# Patient Record
Sex: Male | Born: 2000 | Race: White | Hispanic: No | Marital: Single | Smoking: Never smoker
Health system: Southern US, Community
[De-identification: ages and names within clinical notes are randomized; demographics above are authoritative.]

## PROBLEM LIST (undated history)

## (undated) DIAGNOSIS — F419 Anxiety disorder, unspecified: Secondary | ICD-10-CM

## (undated) DIAGNOSIS — K219 Gastro-esophageal reflux disease without esophagitis: Secondary | ICD-10-CM

## (undated) DIAGNOSIS — J3089 Other allergic rhinitis: Secondary | ICD-10-CM

## (undated) DIAGNOSIS — F329 Major depressive disorder, single episode, unspecified: Secondary | ICD-10-CM

## (undated) DIAGNOSIS — F909 Attention-deficit hyperactivity disorder, unspecified type: Secondary | ICD-10-CM

## (undated) DIAGNOSIS — R1115 Cyclical vomiting syndrome unrelated to migraine: Secondary | ICD-10-CM

## (undated) DIAGNOSIS — F32A Depression, unspecified: Secondary | ICD-10-CM

## (undated) HISTORY — DX: Cyclical vomiting syndrome unrelated to migraine: R11.15

## (undated) HISTORY — DX: Depression, unspecified: F32.A

## (undated) HISTORY — PX: NO PAST SURGERIES: SHX2092

## (undated) HISTORY — DX: Anxiety disorder, unspecified: F41.9

---
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
## 2021-01-08 ENCOUNTER — Other Ambulatory Visit: Payer: Self-pay

## 2021-01-08 ENCOUNTER — Ambulatory Visit (INDEPENDENT_AMBULATORY_CARE_PROVIDER_SITE_OTHER): Payer: 59 | Admitting: Family Medicine

## 2021-01-08 ENCOUNTER — Encounter: Payer: Self-pay | Admitting: Family Medicine

## 2021-01-08 VITALS — BP 108/66 | HR 79 | Temp 98.3°F | Ht 70.0 in | Wt 196.0 lb

## 2021-01-08 DIAGNOSIS — K5901 Slow transit constipation: Secondary | ICD-10-CM

## 2021-01-08 DIAGNOSIS — F418 Other specified anxiety disorders: Secondary | ICD-10-CM

## 2021-01-08 DIAGNOSIS — G4700 Insomnia, unspecified: Secondary | ICD-10-CM | POA: Diagnosis not present

## 2021-01-08 DIAGNOSIS — R1115 Cyclical vomiting syndrome unrelated to migraine: Secondary | ICD-10-CM

## 2021-01-08 DIAGNOSIS — J301 Allergic rhinitis due to pollen: Secondary | ICD-10-CM

## 2021-01-08 DIAGNOSIS — M545 Low back pain, unspecified: Secondary | ICD-10-CM

## 2021-01-08 DIAGNOSIS — Z23 Encounter for immunization: Secondary | ICD-10-CM | POA: Diagnosis not present

## 2021-01-08 DIAGNOSIS — G43719 Chronic migraine without aura, intractable, without status migrainosus: Secondary | ICD-10-CM

## 2021-01-08 DIAGNOSIS — K219 Gastro-esophageal reflux disease without esophagitis: Secondary | ICD-10-CM

## 2021-01-08 MED ORDER — MONTELUKAST SODIUM 10 MG PO TABS: 10.0000 mg | ORAL_TABLET | Freq: Every day | ORAL | 1 refills | Status: DC

## 2021-01-08 MED ORDER — HYDROXYZINE PAMOATE 25 MG PO CAPS: 25.0000 mg | ORAL_CAPSULE | Freq: Two times a day (BID) | ORAL | 1 refills | Status: DC | PRN

## 2021-01-08 MED ORDER — DICLOFENAC SODIUM 75 MG PO TBEC: 75.0000 mg | DELAYED_RELEASE_TABLET | Freq: Two times a day (BID) | ORAL | 5 refills | Status: DC | PRN

## 2021-01-08 MED ORDER — LEVOCETIRIZINE DIHYDROCHLORIDE 5 MG PO TABS: 5.0000 mg | ORAL_TABLET | Freq: Every evening | ORAL | 1 refills | Status: DC

## 2021-01-08 MED ORDER — QUETIAPINE FUMARATE 100 MG PO TABS: 50.0000 mg | ORAL_TABLET | Freq: Every day | ORAL | 1 refills | Status: DC

## 2021-01-08 MED ORDER — PROPRANOLOL HCL 20 MG PO TABS: 20.0000 mg | ORAL_TABLET | Freq: Two times a day (BID) | ORAL | 1 refills | Status: DC

## 2021-01-08 MED ORDER — VENLAFAXINE HCL ER 75 MG PO CP24: 75.0000 mg | ORAL_CAPSULE | Freq: Every day | ORAL | 1 refills | Status: DC

## 2021-01-08 MED ORDER — PANTOPRAZOLE SODIUM 20 MG PO TBEC: 20.0000 mg | DELAYED_RELEASE_TABLET | Freq: Every day | ORAL | 1 refills | Status: DC | PRN

## 2021-01-08 NOTE — Patient Instructions (Addendum)
Great to see you today.  I have refilled the medication(s) we provide.   If labs were collected, we will inform you of lab results once received either by echart message or telephone call.   - echart message- for normal results that have been seen by the patient already.   - telephone call: abnormal results or if patient has not viewed results in their echart.  

## 2021-01-08 NOTE — Progress Notes (Signed)
Patient ID: Stephen Sexton, male  DOB: 2000/05/23, 20 y.o.   MRN: [id number] Patient Care Team    Relationship Specialty Notifications Start End  Ma Hillock, DO PCP - General Family Medicine  10/21/19     Chief Complaint  Patient presents with   Depression    CMC;pt is not fasting    Subjective: Stephen Sexton is a 20 y.o.  male present for [facility] follow up Depression/anxiety/insomnia/ADHD: pt reports compliance  with effexor 75, vistartil prn (rarely needs now) and seroquel 50 mg Qhs.He reports he is sleeping much better and feeling improved from last visit. He tries to focus on the positivity in his life and his mother. His grandmother has dementia and she is having some behavioral changes with her dementia. He feels taking care of her and seeing these changes is causing him more sadness.  Prior note Patient reports he is prescribed trazodone 50 mg nightly for his insomnia.  He does feel this dose causes him to be sleepy in the morning.  He has not tried a lower dose in the past.  He has had a diagnosis of ADHD since he was a child.  He was tried on many different medications the last notable medication on record was Focalin.  He states he has not taken this medication in almost a year.  He reports he has become better adjusted to his ADHD.  Migraine, chronic: Patient reports compliance   with propranolol 20 mg twice daily .He has not been tried on other medications- regimen present since 2016ish. He reports he has not had any migraines since our last visit.   Cyclic vomiting: Pt reports he has had normal BM since started Senakot at night.  He does not take opioids or use MJ.  Prior note: Patient reports he has had a history of cyclic vomiting.  At one time he had a great deal difficulty getting enough nutrients secondary to cyclic vomiting.  He has been tried on Zofran, Phenergan and propranolol for his cyclic vomiting.  Allergies: Patient reports he has been  prescribed Singulair and levocetirizine for his allergies. Allergies are well controlled.   Depression screen [facility] 2/9 01/08/2021 10/08/2020 04/26/2020 10/21/2019  Decreased Interest 1 2 1 2   Down, Depressed, Hopeless 1 3 1 1   PHQ - 2 Score 2 5 2 3   Altered sleeping 0 3 2 3   Tired, decreased energy 0 3 0 3  Change in appetite 1 1 0 2  Feeling bad or failure about yourself  0 2 1 1   Trouble concentrating 0 0 0 1  Moving slowly or fidgety/restless 0 0 0 0  Suicidal thoughts 0 1 0 0  PHQ-9 Score 3 15 5 13   Difficult doing work/chores - - - Somewhat difficult   GAD 7 : Generalized Anxiety Score 01/08/2021 10/08/2020 04/26/2020  Nervous, Anxious, on Edge 1 1 1   Control/stop worrying 0 1 1  Worry too much - different things 1 1 1   Trouble relaxing 0 1 0  Restless 0 1 1  Easily annoyed or irritable 1 1 1   Afraid - awful might happen 0 1 0  Total GAD 7 Score 3 7 5     GAD 7 : Generalized Anxiety Score 01/08/2021 10/08/2020 04/26/2020  Nervous, Anxious, on Edge 1 1 1   Control/stop worrying 0 1 1  Worry too much - different things 1 1 1   Trouble relaxing 0 1 0  Restless 0 1 1  Easily annoyed or  irritable '1 1 1  '$ Afraid - awful might happen 0 1 0  Total GAD 7 Score $Remov'3 7 5        'MstFKR$ No flowsheet data found.  Immunization History  Administered Date(s) Administered   DTaP 06/14/2001, 08/30/2001, 10/04/2001, 12/06/2002, 04/04/2005   HPV 9-valent 03/22/2015, 09/13/2015   Hepatitis A, Ped/Adol-2 Dose 06/18/2006, 04/12/2010   Hepatitis B, ped/adol 03-Oct-2001, 05/12/2001, 12/30/2001   HiB (PRP-T) 06/14/2001, 08/30/2001, 10/04/2001, 08/17/2002   IPV 06/14/2001, 08/30/2001, 04/28/2002, 04/04/2005   Influenza Split 03/15/2009, 04/18/2009, 03/21/2010   Influenza, Seasonal, Injecte, Preservative Fre 02/21/2011   Influenza,inj,Quad PF,6+ Mos 03/22/2015, 12/13/2015, 11/25/2019, 01/08/2021   Influenza-Unspecified 11/12/2016   MMR 08/17/2002   MMRV 04/04/2005   Meningococcal Conjugate 05/06/2012    PFIZER(Purple Top)SARS-COV-2 Vaccination 06/30/2019, 07/20/2019   Pneumococcal Conjugate-13 06/14/2001, 08/30/2001, 10/04/2001, 04/28/2002   Tdap 05/06/2012   Varicella 04/28/2002    No results found.  Past Medical History:  Diagnosis Date   Abdominal migraine 09/08/2017   Acid reflux    ADHD    Anxiety    Cyclic vomiting syndrome    Depression    Dysphonia 04/14/2017   Environmental and seasonal allergies    Tension headache 12/26/2016   Allergies  Allergen Reactions   Amitriptyline     Anger   Past Surgical History:  Procedure Laterality Date   NO PAST SURGERIES     Family History  Problem Relation Age of Onset   Arthritis Mother    Diabetes Mother    Hypertension Mother    Hyperlipidemia Mother    Arthritis Father    Drug abuse Brother    Arthritis Maternal Grandmother    Cancer Maternal Grandmother    Depression Maternal Grandmother    Diabetes Maternal Grandmother    Hyperlipidemia Maternal Grandmother    Hypertension Maternal Grandmother    Miscarriages / Stillbirths Maternal Grandmother    Arthritis Maternal Grandfather    Cancer Maternal Grandfather    Diabetes Maternal Grandfather    Heart attack Maternal Grandfather    Heart disease Maternal Grandfather    Hyperlipidemia Maternal Grandfather    Hypertension Maternal Grandfather    Miscarriages / Stillbirths Paternal Grandmother    Stroke Paternal Grandmother    Hypertension Paternal Grandmother    Diabetes Paternal Grandmother    Cancer Paternal Grandmother    Cancer Paternal Grandfather    Diabetes Paternal Grandfather    Social History   Social History Narrative   Marital status/children/pets: Single.  Lives with mother and grandparent.   Education/employment: 12th grade graduate> attending college   Safety:      -Wears a bicycle helmet riding a bike: Yes     -smoke alarm in the home:Yes     - wears seatbelt: Yes     - Feels safe in their relationships: Yes    Allergies as of 01/08/2021        Reactions   Amitriptyline    Anger        Medication List        Accurate as of January 08, 2021  8:51 AM. If you have any questions, ask your nurse or doctor.          diclofenac 75 MG EC tablet Commonly known as: VOLTAREN Take 1 tablet (75 mg total) by mouth 2 (two) times daily as needed.   hydrOXYzine 25 MG capsule Commonly known as: Vistaril Take 1 capsule (25 mg total) by mouth 2 (two) times daily as needed.   levocetirizine 5 MG tablet  Commonly known as: XYZAL Take 1 tablet (5 mg total) by mouth every evening.   montelukast 10 MG tablet Commonly known as: SINGULAIR Take 1 tablet (10 mg total) by mouth at bedtime.   ondansetron 8 MG tablet Commonly known as: Zofran Take 1 tablet (8 mg total) by mouth every 8 (eight) hours as needed for nausea or vomiting.   pantoprazole 20 MG tablet Commonly known as: PROTONIX Take 1 tablet (20 mg total) by mouth daily as needed (Acid Reflux).   propranolol 20 MG tablet Commonly known as: INDERAL Take 1 tablet (20 mg total) by mouth 2 (two) times daily.   QUEtiapine 100 MG tablet Commonly known as: SEROquel Take 0.5 tablets (50 mg total) by mouth at bedtime. What changed: how much to take Changed by: Howard Pouch, DO   tiZANidine 2 MG tablet Commonly known as: ZANAFLEX Take 1 tablet (2 mg total) by mouth at bedtime. What changed:  when to take this reasons to take this   venlafaxine XR 75 MG 24 hr capsule Commonly known as: Effexor XR Take 1 capsule (75 mg total) by mouth daily with breakfast.        All past medical history, surgical history, allergies, family history, immunizations andmedications were updated in the EMR today and reviewed under the history and medication portions of their EMR.      ROS: 14 pt review of systems performed and negative (unless mentioned in an HPI)  Objective: BP 108/66   Pulse 79   Temp 98.3 F (36.8 C) (Oral)   Ht 5\' 10"  (1.778 m)   Wt 196 lb (88.9 kg)   SpO2  96%   BMI 28.12 kg/m  Gen: Afebrile. No acute distress. Nontoxic, very pleasant male.  HENT: AT. [facility]. No cough. MMM Eyes:Pupils Equal Round Reactive to light, Extraocular movements intact,  Conjunctiva without redness, discharge or icterus. CV: RRR no murmur Chest: CTAB, no wheeze or crackles Skin: no rashes, purpura or petechiae.  Neuro:  Normal gait. PERLA. EOMi. Alert. Oriented x3 Psych: Normal affect, dress and demeanor. Normal speech. Normal thought content and judgment..    Assessment/plan: Stephen Sexton is a 20 y.o. male present for  Chronic migraine Stable.  Continue  propranolol 20 mg BID  GERD/cyclic vomiting Stable.  Continue Protonix 20 QD continue senakot nightly. Goal 3 BM a week.  Consider Remeron, Topamax, B6 if worsening.  Reported allergy to amitriptyline  Depression with anxiety Feeling much better.  Continue Effexor 75 mg QD  Continue Vistaril 25 mg BID PRN Continue seroquel50mg  [facility]hs Referral to psychiatry placed for him last visit.  F/u 3 mos.   Allergic rhinitis due to pollen, unspecified seasonality Stable.  Continue Xyzal Continue Singulair  Influenza immunization provided today.   Return in about 5 months (around 06/24/2021) for [facility].  Orders Placed This Encounter  Procedures   Flu Vaccine QUAD 6+ mos PF IM (Fluarix Quad PF)    Meds ordered this encounter  Medications   QUEtiapine (SEROQUEL) 100 MG tablet    Sig: Take 0.5 tablets (50 mg total) by mouth at bedtime.    Dispense:  45 tablet    Refill:  1   diclofenac (VOLTAREN) 75 MG EC tablet    Sig: Take 1 tablet (75 mg total) by mouth 2 (two) times daily as needed.    Dispense:  30 tablet    Refill:  5   hydrOXYzine (VISTARIL) 25 MG capsule    Sig: Take 1 capsule (25 mg total) by mouth  2 (two) times daily as needed.    Dispense:  180 capsule    Refill:  1   levocetirizine (XYZAL) 5 MG tablet    Sig: Take 1 tablet (5 mg total) by mouth every evening.    Dispense:  90 tablet     Refill:  1   montelukast (SINGULAIR) 10 MG tablet    Sig: Take 1 tablet (10 mg total) by mouth at bedtime.    Dispense:  90 tablet    Refill:  1   pantoprazole (PROTONIX) 20 MG tablet    Sig: Take 1 tablet (20 mg total) by mouth daily as needed (Acid Reflux).    Dispense:  90 tablet    Refill:  1   propranolol (INDERAL) 20 MG tablet    Sig: Take 1 tablet (20 mg total) by mouth 2 (two) times daily.    Dispense:  180 tablet    Refill:  1   venlafaxine XR (EFFEXOR XR) 75 MG 24 hr capsule    Sig: Take 1 capsule (75 mg total) by mouth daily with breakfast.    Dispense:  90 capsule    Refill:  1     Referral Orders  No referral(s) requested today    Note is dictated utilizing voice recognition software. Although note has been proof read prior to signing, occasional typographical errors still can be missed. If any questions arise, please do not hesitate to call for verification.  Electronically signed by: Howard Pouch, DO [place]

## 2021-01-21 NOTE — Progress Notes (Signed)
[name] D.[name] [address] [place] [address] Phone: [phone]   Assessment and Plan:     1. Chronic bilateral low back pain without sciatica 2. Somatic dysfunction of cervical region 3. Somatic dysfunction of thoracic region 4. Somatic dysfunction of lumbar region 5. Somatic dysfunction of pelvic region 6. Somatic dysfunction of rib region - chronic with exacerbation, subsequent visit - Recurrence of typical musculoskeletal complaints with most prominent being low back today.  No red flag symptoms - Patient has received significant relief with OMT in the past.  Elects for repeat OMT today.  Tolerated well per note below. - Decision today to treat with OMT was based on Physical Exam   After verbal consent patient was treated with HVLA (high velocity low amplitude), ME (muscle energy), FPR (flex positional release), ST (soft tissue), PC/PD (Pelvic Compression/ Pelvic Decompression) techniques in cervical, rib, thoracic, lumbar, and pelvic areas. Patient tolerated the procedure well with improvement in symptoms.  Patient educated on potential side effects of soreness and recommended to rest, hydrate, and use Tylenol as needed for pain control.   Pertinent previous records reviewed include none pertinent   Follow Up: 4 weeks for repeat OMT   Subjective:   I, Debbe Odea, am serving as a scribe for Dr. Richardean Sale  Chief Complaint: Neck and back pain   HPI:   01/22/21 Patient is a 20 year old male presenting with neck and back pain. Patient last saw [name] on 11/15/2020 for this reason and had OMT. Today patient states that he is here for maintenance OMT.    Relevant Historical Information: None pertinent  Additional pertinent review of systems negative.  Current Outpatient Medications  Medication Sig Dispense Refill   diclofenac (VOLTAREN) 75 MG EC tablet Take 1 tablet (75 mg total) by mouth 2 (two) times daily as needed.  30 tablet 5   hydrOXYzine (VISTARIL) 25 MG capsule Take 1 capsule (25 mg total) by mouth 2 (two) times daily as needed. 180 capsule 1   levocetirizine (XYZAL) 5 MG tablet Take 1 tablet (5 mg total) by mouth every evening. 90 tablet 1   montelukast (SINGULAIR) 10 MG tablet Take 1 tablet (10 mg total) by mouth at bedtime. 90 tablet 1   ondansetron (ZOFRAN) 8 MG tablet Take 1 tablet (8 mg total) by mouth every 8 (eight) hours as needed for nausea or vomiting. 20 tablet 0   pantoprazole (PROTONIX) 20 MG tablet Take 1 tablet (20 mg total) by mouth daily as needed (Acid Reflux). 90 tablet 1   propranolol (INDERAL) 20 MG tablet Take 1 tablet (20 mg total) by mouth 2 (two) times daily. 180 tablet 1   QUEtiapine (SEROQUEL) 100 MG tablet Take 0.5 tablets (50 mg total) by mouth at bedtime. 45 tablet 1   tiZANidine (ZANAFLEX) 2 MG tablet Take 1 tablet (2 mg total) by mouth at bedtime. (Patient taking differently: Take 2 mg by mouth at bedtime as needed.) 30 tablet 0   venlafaxine XR (EFFEXOR XR) 75 MG 24 hr capsule Take 1 capsule (75 mg total) by mouth daily with breakfast. 90 capsule 1   No current facility-administered medications for this visit.      Objective:     Vitals:   01/22/21 1330  BP: 110/70  Pulse: 83  SpO2: 97%  Weight: 194 lb (88 kg)  Height: 5\' 10"  (1.778 m)      Body mass index is 27.84 kg/m.    Physical  Exam:     General: Well-appearing, cooperative, sitting comfortably in no acute distress.   OMT Physical Exam:  ASIS Compression Test: Positive Right Cervical: TTP paraspinal, C5 RRSL, C3 RLSR Rib: Bilateral elevated first rib with out TTP Thoracic: TTP paraspinal, T6-9 RRSL Lumbar: TTP paraspinal, L1-3 RLSR Pelvis: Right anterior innominate  Electronically signed by:  [name] D.[name] 1:49 PM 01/22/21

## 2021-01-22 ENCOUNTER — Ambulatory Visit (INDEPENDENT_AMBULATORY_CARE_PROVIDER_SITE_OTHER): Payer: 59 | Admitting: Sports Medicine

## 2021-01-22 ENCOUNTER — Other Ambulatory Visit: Payer: Self-pay

## 2021-01-22 VITALS — BP 110/70 | HR 83 | Ht 70.0 in | Wt 194.0 lb

## 2021-01-22 DIAGNOSIS — M9908 Segmental and somatic dysfunction of rib cage: Secondary | ICD-10-CM

## 2021-01-22 DIAGNOSIS — M9903 Segmental and somatic dysfunction of lumbar region: Secondary | ICD-10-CM

## 2021-01-22 DIAGNOSIS — M9905 Segmental and somatic dysfunction of pelvic region: Secondary | ICD-10-CM

## 2021-01-22 DIAGNOSIS — M9902 Segmental and somatic dysfunction of thoracic region: Secondary | ICD-10-CM | POA: Diagnosis not present

## 2021-01-22 DIAGNOSIS — M545 Low back pain, unspecified: Secondary | ICD-10-CM

## 2021-01-22 DIAGNOSIS — M9901 Segmental and somatic dysfunction of cervical region: Secondary | ICD-10-CM | POA: Diagnosis not present

## 2021-01-22 DIAGNOSIS — G8929 Other chronic pain: Secondary | ICD-10-CM | POA: Diagnosis not present

## 2021-01-22 NOTE — Patient Instructions (Signed)
Good to see you   Follow up in 4 week for repeat OMT

## 2021-02-18 NOTE — Progress Notes (Signed)
[name] D.[name] [address] [place] [address] Phone: [phone]   Assessment and Plan:     1. Neck pain 2. Somatic dysfunction of cervical region 3. Somatic dysfunction of thoracic region 4. Somatic dysfunction of lumbar region 5. Somatic dysfunction of pelvic region 6. Somatic dysfunction of rib region -Chronic with exacerbation, subsequent visit - Recurrence of multiple musculoskeletal complaints with most prominent being neck pain without radicular symptoms - Patient has received significant relief with OMT in the past.  Elects for repeat OMT today.  Tolerated well per note below. - Decision today to treat with OMT was based on Physical Exam   After verbal consent patient was treated with HVLA (high velocity low amplitude), ME (muscle energy), FPR (flex positional release), ST (soft tissue), PC/PD (Pelvic Compression/ Pelvic Decompression) techniques in cervical, rib, thoracic, lumbar, and pelvic areas. Patient tolerated the procedure well with improvement in symptoms.  Patient educated on potential side effects of soreness and recommended to rest, hydrate, and use Tylenol as needed for pain control.   Pertinent previous records reviewed include none   Follow Up: 4 weeks for repeat OMT   Subjective:    I, [name], am serving as a Neurosurgeon for [name]  Chief Complaint: neck and back pain   HPI:   01/22/21 Patient is a 20 year old male presenting with neck and back pain. Patient last saw [name] on 11/15/2020 for this reason and had OMT. Today patient states that he is here for maintenance OMT.    02/19/2021 Patient states after the last OMT visit the following week the neck got tight after laying on a friends bed gave him a crick in his neck for the next two days , back and neck still feel tight   Relevant Historical Information: None pertinent  Additional pertinent review of systems negative.  Current  Outpatient Medications  Medication Sig Dispense Refill   diclofenac (VOLTAREN) 75 MG EC tablet Take 1 tablet (75 mg total) by mouth 2 (two) times daily as needed. 30 tablet 5   hydrOXYzine (VISTARIL) 25 MG capsule Take 1 capsule (25 mg total) by mouth 2 (two) times daily as needed. 180 capsule 1   levocetirizine (XYZAL) 5 MG tablet Take 1 tablet (5 mg total) by mouth every evening. 90 tablet 1   montelukast (SINGULAIR) 10 MG tablet Take 1 tablet (10 mg total) by mouth at bedtime. 90 tablet 1   ondansetron (ZOFRAN) 8 MG tablet Take 1 tablet (8 mg total) by mouth every 8 (eight) hours as needed for nausea or vomiting. 20 tablet 0   pantoprazole (PROTONIX) 20 MG tablet Take 1 tablet (20 mg total) by mouth daily as needed (Acid Reflux). 90 tablet 1   propranolol (INDERAL) 20 MG tablet Take 1 tablet (20 mg total) by mouth 2 (two) times daily. 180 tablet 1   QUEtiapine (SEROQUEL) 100 MG tablet Take 0.5 tablets (50 mg total) by mouth at bedtime. 45 tablet 1   tiZANidine (ZANAFLEX) 2 MG tablet Take 1 tablet (2 mg total) by mouth at bedtime. (Patient taking differently: Take 2 mg by mouth at bedtime as needed.) 30 tablet 0   venlafaxine XR (EFFEXOR XR) 75 MG 24 hr capsule Take 1 capsule (75 mg total) by mouth daily with breakfast. 90 capsule 1   No current facility-administered medications for this visit.      Objective:     Vitals:   02/19/21 1252  BP: 100/80  Pulse: 67  SpO2: 98%  Weight: 190 lb (86.2 kg)  Height: 5\' 10"  (1.778 m)      Body mass index is 27.26 kg/m.    Physical Exam:     General: Well-appearing, cooperative, sitting comfortably in no acute distress.   OMT Physical Exam:  ASIS Compression Test: Positive Right Cervical: TTP paraspinal, C3 RLSR Rib: Bilateral elevated first rib with nTTP Thoracic: TTP paraspinal, T4 RRSR Lumbar: TTP paraspinal, L1-3 RLSR Pelvis: Right anterior innominate with inflare  Electronically signed by:  D[name] 1:08 PM 02/19/21

## 2021-02-19 ENCOUNTER — Ambulatory Visit (INDEPENDENT_AMBULATORY_CARE_PROVIDER_SITE_OTHER): Payer: 59 | Admitting: Sports Medicine

## 2021-02-19 ENCOUNTER — Other Ambulatory Visit: Payer: Self-pay

## 2021-02-19 VITALS — BP 100/80 | HR 67 | Ht 70.0 in | Wt 190.0 lb

## 2021-02-19 DIAGNOSIS — M9905 Segmental and somatic dysfunction of pelvic region: Secondary | ICD-10-CM

## 2021-02-19 DIAGNOSIS — M9902 Segmental and somatic dysfunction of thoracic region: Secondary | ICD-10-CM | POA: Diagnosis not present

## 2021-02-19 DIAGNOSIS — M9901 Segmental and somatic dysfunction of cervical region: Secondary | ICD-10-CM

## 2021-02-19 DIAGNOSIS — M542 Cervicalgia: Secondary | ICD-10-CM

## 2021-02-19 DIAGNOSIS — M9903 Segmental and somatic dysfunction of lumbar region: Secondary | ICD-10-CM | POA: Diagnosis not present

## 2021-02-19 DIAGNOSIS — M9908 Segmental and somatic dysfunction of rib cage: Secondary | ICD-10-CM

## 2021-02-19 NOTE — Patient Instructions (Addendum)
Good to see you  °4 week follow up for repeat OMT °

## 2021-03-19 ENCOUNTER — Ambulatory Visit: Payer: 59 | Admitting: Sports Medicine

## 2021-03-22 NOTE — Progress Notes (Signed)
Stephen Sexton D.[name] [address] [place] [address] Phone: [phone]   Assessment and Plan:    1. Neck pain 2. Somatic dysfunction of cervical region 3. Somatic dysfunction of thoracic region 4. Somatic dysfunction of lumbar region 5. Somatic dysfunction of pelvic region 6. Somatic dysfunction of rib region -Chronic with exacerbation, subsequent sports medicine visit - Recurrence of multiple musculoskeletal complaints with most prominent being in right upper back - No red flag symptoms, so no additional imaging - Patient has received significant relief with OMT in the past.  Elects for repeat OMT today.  Tolerated well per note below. - Decision today to treat with OMT was based on Physical Exam   After verbal consent patient was treated with HVLA (high velocity low amplitude), ME (muscle energy), FPR (flex positional release), ST (soft tissue), PC/PD (Pelvic Compression/ Pelvic Decompression) techniques in cervical, rib, thoracic, lumbar, and pelvic areas. Patient tolerated the procedure well with improvement in symptoms.  Patient educated on potential side effects of soreness and recommended to rest, hydrate, and use Tylenol as needed for pain control.   Pertinent previous records reviewed include none   Follow Up: 4 weeks for maintenance OMT   Subjective:   I, Stephen Sexton, am serving as a Neurosurgeon for [name]  Chief Complaint: neck pain   HPI:   01/22/21 Patient is a 21 year old male presenting with neck and back pain. Patient last saw [name] on 11/15/2020 for this reason and had OMT. Today patient states that he is here for maintenance OMT.     02/19/2021 Patient states after the last OMT visit the following week the neck got tight after laying on a friends bed gave him a crick in his neck for the next two days , back and neck still feel tight   03/25/2021 Patient states that he's feeling pretty good pain in his  right shoulder blade.  States that he has felt good generally for about 4 weeks in between appointments, however going longer than 4 weeks he notices that pain started to return.  No new trauma or symptoms.    Relevant Historical Information: None pertinent  Additional pertinent review of systems negative.  Current Outpatient Medications  Medication Sig Dispense Refill   diclofenac (VOLTAREN) 75 MG EC tablet Take 1 tablet (75 mg total) by mouth 2 (two) times daily as needed. 30 tablet 5   hydrOXYzine (VISTARIL) 25 MG capsule Take 1 capsule (25 mg total) by mouth 2 (two) times daily as needed. 180 capsule 1   levocetirizine (XYZAL) 5 MG tablet Take 1 tablet (5 mg total) by mouth every evening. 90 tablet 1   montelukast (SINGULAIR) 10 MG tablet Take 1 tablet (10 mg total) by mouth at bedtime. 90 tablet 1   ondansetron (ZOFRAN) 8 MG tablet Take 1 tablet (8 mg total) by mouth every 8 (eight) hours as needed for nausea or vomiting. 20 tablet 0   pantoprazole (PROTONIX) 20 MG tablet Take 1 tablet (20 mg total) by mouth daily as needed (Acid Reflux). 90 tablet 1   propranolol (INDERAL) 20 MG tablet Take 1 tablet (20 mg total) by mouth 2 (two) times daily. 180 tablet 1   QUEtiapine (SEROQUEL) 100 MG tablet Take 0.5 tablets (50 mg total) by mouth at bedtime. 45 tablet 1   tiZANidine (ZANAFLEX) 2 MG tablet Take 1 tablet (2 mg total) by mouth at bedtime. (Patient taking differently: Take 2 mg by mouth at  bedtime as needed.) 30 tablet 0   venlafaxine XR (EFFEXOR XR) 75 MG 24 hr capsule Take 1 capsule (75 mg total) by mouth daily with breakfast. 90 capsule 1   No current facility-administered medications for this visit.      Objective:     Vitals:   03/25/21 1436  BP: 110/70  Pulse: 84  SpO2: 98%  Weight: 191 lb (86.6 kg)  Height: 5\' 10"  (1.778 m)      Body mass index is 27.41 kg/m.    Physical Exam:     General: Well-appearing, cooperative, sitting comfortably in no acute distress.   OMT  Physical Exam:  ASIS Compression Test: Positive Right Cervical: TTP paraspinal, C3 RRSL, C6 RLSR Rib: Bilateral elevated first rib with nTTP Thoracic: Mild TTP paraspinal, T3-6 RRSL Lumbar: Mild TTP paraspinal, L1-3 RLSR Pelvis: Right posterior innominate  Electronically signed by:  D.[facility] 2:53 PM 03/25/21

## 2021-03-25 ENCOUNTER — Ambulatory Visit (INDEPENDENT_AMBULATORY_CARE_PROVIDER_SITE_OTHER): Payer: 59 | Admitting: Sports Medicine

## 2021-03-25 ENCOUNTER — Other Ambulatory Visit: Payer: Self-pay

## 2021-03-25 VITALS — BP 110/70 | HR 84 | Ht 70.0 in | Wt 191.0 lb

## 2021-03-25 DIAGNOSIS — M9901 Segmental and somatic dysfunction of cervical region: Secondary | ICD-10-CM

## 2021-03-25 DIAGNOSIS — M9902 Segmental and somatic dysfunction of thoracic region: Secondary | ICD-10-CM

## 2021-03-25 DIAGNOSIS — M542 Cervicalgia: Secondary | ICD-10-CM

## 2021-03-25 DIAGNOSIS — M9903 Segmental and somatic dysfunction of lumbar region: Secondary | ICD-10-CM | POA: Diagnosis not present

## 2021-03-25 DIAGNOSIS — M9905 Segmental and somatic dysfunction of pelvic region: Secondary | ICD-10-CM

## 2021-03-25 DIAGNOSIS — M9908 Segmental and somatic dysfunction of rib cage: Secondary | ICD-10-CM

## 2021-03-25 NOTE — Patient Instructions (Addendum)
Good to see you  4 week follow up   

## 2021-04-15 NOTE — Progress Notes (Signed)
Stephen Sexton D.[facility] [address] [place] [address] Phone: [phone]   Assessment and Plan:     1. Chronic bilateral low back pain without sciatica 2. Somatic dysfunction of thoracic region 3. Somatic dysfunction of lumbar region 4. Somatic dysfunction of pelvic region -Chronic with exacerbation, subsequent visit - Recurrence of multiple musculoskeletal complaints with most prominent being in bilateral hips and low back - No red flag symptoms, so no imaging taken today - Patient has received significant relief with OMT in the past.  Elects for repeat OMT today.  Tolerated well per note below. - Decision today to treat with OMT was based on Physical Exam   After verbal consent patient was treated with HVLA (high velocity low amplitude), ME (muscle energy), FPR (flex positional release), ST (soft tissue), PC/PD (Pelvic Compression/ Pelvic Decompression) techniques in cervical, rib, thoracic, lumbar, and pelvic areas. Patient tolerated the procedure well with improvement in symptoms.  Patient educated on potential side effects of soreness and recommended to rest, hydrate, and use Tylenol as needed for pain control.   Pertinent previous records reviewed include none   Follow Up: 4 weeks for OMT maintenance.  It has been 6 weeks since his last OMT, and patient feels that his symptoms are returning after 4 weeks.  We will plan on continued 4-week visits   Subjective:   I, Stephen Sexton, am serving as a Neurosurgeon for Doctor Stephen Sexton  Chief Complaint: neck pain   HPI:    01/22/21 Patient is a 21 year old male presenting with neck and back pain. Patient last saw Dr. Katrinka Sexton on 11/15/2020 for this reason and had OMT. Today patient states that he is here for maintenance OMT.     02/19/2021 Patient states after the last OMT visit the following week the neck got tight after laying on a friends bed gave him a crick in his neck for the next two days ,  back and neck still feel tight    03/25/2021 Patient states that he's feeling pretty good pain in his right shoulder blade.  States that he has felt good generally for about 4 weeks in between appointments, however going longer than 4 weeks he notices that pain started to return.  No new trauma or symptoms.  04/22/2021 Patient states that his hips have been hurting him the last couple of days, R shoulder has been giving him some problems 04/03/2021.     Relevant Historical Information: None pertinent  Additional pertinent review of systems negative.  Current Outpatient Medications  Medication Sig Dispense Refill   diclofenac (VOLTAREN) 75 MG EC tablet Take 1 tablet (75 mg total) by mouth 2 (two) times daily as needed. 30 tablet 5   hydrOXYzine (VISTARIL) 25 MG capsule Take 1 capsule (25 mg total) by mouth 2 (two) times daily as needed. 180 capsule 1   levocetirizine (XYZAL) 5 MG tablet Take 1 tablet (5 mg total) by mouth every evening. 90 tablet 1   montelukast (SINGULAIR) 10 MG tablet Take 1 tablet (10 mg total) by mouth at bedtime. 90 tablet 1   ondansetron (ZOFRAN) 8 MG tablet Take 1 tablet (8 mg total) by mouth every 8 (eight) hours as needed for nausea or vomiting. 20 tablet 0   pantoprazole (PROTONIX) 20 MG tablet Take 1 tablet (20 mg total) by mouth daily as needed (Acid Reflux). 90 tablet 1   propranolol (INDERAL) 20 MG tablet Take 1 tablet (20 mg total) by mouth 2 (  two) times daily. 180 tablet 1   QUEtiapine (SEROQUEL) 100 MG tablet Take 0.5 tablets (50 mg total) by mouth at bedtime. 45 tablet 1   tiZANidine (ZANAFLEX) 2 MG tablet Take 1 tablet (2 mg total) by mouth at bedtime. (Patient taking differently: Take 2 mg by mouth at bedtime as needed.) 30 tablet 0   venlafaxine XR (EFFEXOR XR) 75 MG 24 hr capsule Take 1 capsule (75 mg total) by mouth daily with breakfast. 90 capsule 1   No current facility-administered medications for this visit.      Objective:     Vitals:    04/22/21 1457  BP: 110/72  Pulse: 96  SpO2: 98%  Weight: 190 lb (86.2 kg)  Height: 5\' 10"  (1.778 m)      Body mass index is 27.26 kg/m.    Physical Exam:     General: Well-appearing, cooperative, sitting comfortably in no acute distress.   OMT Physical Exam:  ASIS Compression Test: Positive Right Thoracic: TTP paraspinal, T8-10 RRSL Lumbar: TTP paraspinal, L1-3 RLSR, L5 rotated right Pelvis: Right posterior innominate, left anterior innominate  Electronically signed by:  [facility] 3:14 PM 04/22/21

## 2021-04-22 ENCOUNTER — Ambulatory Visit (INDEPENDENT_AMBULATORY_CARE_PROVIDER_SITE_OTHER): Payer: 59 | Admitting: Sports Medicine

## 2021-04-22 ENCOUNTER — Other Ambulatory Visit: Payer: Self-pay

## 2021-04-22 VITALS — BP 110/72 | HR 96 | Ht 70.0 in | Wt 190.0 lb

## 2021-04-22 DIAGNOSIS — G8929 Other chronic pain: Secondary | ICD-10-CM

## 2021-04-22 DIAGNOSIS — M545 Low back pain, unspecified: Secondary | ICD-10-CM

## 2021-04-22 DIAGNOSIS — M9902 Segmental and somatic dysfunction of thoracic region: Secondary | ICD-10-CM

## 2021-04-22 DIAGNOSIS — M9903 Segmental and somatic dysfunction of lumbar region: Secondary | ICD-10-CM

## 2021-04-22 DIAGNOSIS — M9905 Segmental and somatic dysfunction of pelvic region: Secondary | ICD-10-CM

## 2021-04-22 NOTE — Patient Instructions (Addendum)
Good to see you  °4 week follow up for repeat OMT °

## 2021-05-10 ENCOUNTER — Ambulatory Visit (INDEPENDENT_AMBULATORY_CARE_PROVIDER_SITE_OTHER): Payer: 59 | Admitting: Nurse Practitioner

## 2021-05-10 ENCOUNTER — Encounter: Payer: Self-pay | Admitting: Nurse Practitioner

## 2021-05-10 ENCOUNTER — Other Ambulatory Visit: Payer: Self-pay

## 2021-05-10 VITALS — BP 118/68 | HR 110 | Temp 97.9°F | Wt 199.4 lb

## 2021-05-10 DIAGNOSIS — K029 Dental caries, unspecified: Secondary | ICD-10-CM

## 2021-05-10 MED ORDER — AMOXICILLIN-POT CLAVULANATE 875-125 MG PO TABS: 1.0000 | ORAL_TABLET | Freq: Two times a day (BID) | ORAL | 0 refills | Status: DC

## 2021-05-10 NOTE — Progress Notes (Signed)
? ?Acute Office Visit ? ?Subjective:  ? ? Patient ID: Stephen Sexton, male    DOB: 2000/10/25, 21 y.o.   MRN: [id number] ? ?Chief Complaint  ?Patient presents with  ? Oral Pain  ?  Pt c/o toothache x2-3 days  ? ? ?HPI ?Patient is in today for right sided tooth pain that has been occurring off and on since November 2022. He went to a dentist and was told he has a large cavity, however he can't afford the treatment. He is looking for a new dentist to pull the tooth. He has been having trouble eating due the pain. He denies fevers. He has been taking ibuprofen and tylenol which sometimes helps the pain, but not when it is severe like last night. The pain tends to be intermittent.  ? ?Past Medical History:  ?Diagnosis Date  ? Abdominal migraine 09/08/2017  ? Acid reflux   ? ADHD   ? Anxiety   ? Cyclic vomiting syndrome   ? Depression   ? Dysphonia 04/14/2017  ? Environmental and seasonal allergies   ? Tension headache 12/26/2016  ? ? ?Past Surgical History:  ?Procedure Laterality Date  ? NO PAST SURGERIES    ? ? ?Family History  ?Problem Relation Age of Onset  ? Arthritis Mother   ? Diabetes Mother   ? Hypertension Mother   ? Hyperlipidemia Mother   ? Arthritis Father   ? Drug abuse Brother   ? Arthritis Maternal Grandmother   ? Cancer Maternal Grandmother   ? Depression Maternal Grandmother   ? Diabetes Maternal Grandmother   ? Hyperlipidemia Maternal Grandmother   ? Hypertension Maternal Grandmother   ? Miscarriages / Stillbirths Maternal Grandmother   ? Arthritis Maternal Grandfather   ? Cancer Maternal Grandfather   ? Diabetes Maternal Grandfather   ? Heart attack Maternal Grandfather   ? Heart disease Maternal Grandfather   ? Hyperlipidemia Maternal Grandfather   ? Hypertension Maternal Grandfather   ? Miscarriages / Stillbirths Paternal Grandmother   ? Stroke Paternal Grandmother   ? Hypertension Paternal Grandmother   ? Diabetes Paternal Grandmother   ? Cancer Paternal Grandmother   ? Cancer Paternal  Grandfather   ? Diabetes Paternal Grandfather   ? ? ?Social History  ? ?Socioeconomic History  ? Marital status: Single  ?  Spouse name: Not on file  ? Number of children: Not on file  ? Years of education: Not on file  ? Highest education level: Not on file  ?Occupational History  ? Not on file  ?Tobacco Use  ? Smoking status: Never  ? Smokeless tobacco: Never  ?Vaping Use  ? Vaping Use: Never used  ?Substance and Sexual Activity  ? Alcohol use: Not Currently  ? Drug use: Never  ? Sexual activity: Not Currently  ?  Partners: Female  ?Other Topics Concern  ? Not on file  ?Social History Narrative  ? Marital status/children/pets: Single.  Lives with mother and grandparent.  ? Education/employment: 12th grade graduate> attending college  ? Safety:   ?   -Wears a bicycle helmet riding a bike: Yes  ?   -smoke alarm in the home:Yes  ?   - wears seatbelt: Yes  ?   - Feels safe in their relationships: Yes  ? ?Social Determinants of Health  ? ?Financial Resource Strain: Not on file  ?Food Insecurity: Not on file  ?Transportation Needs: Not on file  ?Physical Activity: Not on file  ?Stress: Not on file  ?Social Connections:  Not on file  ?Intimate Partner Violence: Not on file  ? ? ?Outpatient Medications Prior to Visit  ?Medication Sig Dispense Refill  ? diclofenac (VOLTAREN) 75 MG EC tablet Take 1 tablet (75 mg total) by mouth 2 (two) times daily as needed. 30 tablet 5  ? hydrOXYzine (VISTARIL) 25 MG capsule Take 1 capsule (25 mg total) by mouth 2 (two) times daily as needed. 180 capsule 1  ? levocetirizine (XYZAL) 5 MG tablet Take 1 tablet (5 mg total) by mouth every evening. 90 tablet 1  ? montelukast (SINGULAIR) 10 MG tablet Take 1 tablet (10 mg total) by mouth at bedtime. 90 tablet 1  ? ondansetron (ZOFRAN) 8 MG tablet Take 1 tablet (8 mg total) by mouth every 8 (eight) hours as needed for nausea or vomiting. 20 tablet 0  ? pantoprazole (PROTONIX) 20 MG tablet Take 1 tablet (20 mg total) by mouth daily as needed (Acid  Reflux). 90 tablet 1  ? propranolol (INDERAL) 20 MG tablet Take 1 tablet (20 mg total) by mouth 2 (two) times daily. 180 tablet 1  ? QUEtiapine (SEROQUEL) 100 MG tablet Take 0.5 tablets (50 mg total) by mouth at bedtime. 45 tablet 1  ? tiZANidine (ZANAFLEX) 2 MG tablet Take 1 tablet (2 mg total) by mouth at bedtime. (Patient taking differently: Take 2 mg by mouth at bedtime as needed.) 30 tablet 0  ? venlafaxine XR (EFFEXOR XR) 75 MG 24 hr capsule Take 1 capsule (75 mg total) by mouth daily with breakfast. 90 capsule 1  ? ?No facility-administered medications prior to visit.  ? ? ?Allergies  ?Allergen Reactions  ? Amitriptyline   ?  Anger  ? ? ?Review of Systems ?See pertinent positives and negatives per HPI. ?   ?Objective:  ?  ?Physical Exam ?Vitals and nursing note reviewed.  ?Constitutional:   ?   Appearance: Normal appearance.  ?HENT:  ?   Head: Normocephalic.  ?   Mouth/Throat:  ?   Dentition: Dental caries present.  ?Eyes:  ?   Conjunctiva/sclera: Conjunctivae normal.  ?Cardiovascular:  ?   Rate and Rhythm: Normal rate.  ?Pulmonary:  ?   Effort: Pulmonary effort is normal.  ?Musculoskeletal:  ?   Cervical back: Normal range of motion.  ?Skin: ?   General: Skin is warm.  ?Neurological:  ?   General: No focal deficit present.  ?   Mental Status: He is alert and oriented to person, place, and time.  ?Psychiatric:     ?   Mood and Affect: Mood normal.     ?   Behavior: Behavior normal.     ?   Thought Content: Thought content normal.     ?   Judgment: Judgment normal.  ? ? ?BP 118/68   Pulse (!) 110   Temp 97.9 ?F (36.6 ?C) (Temporal)   Wt 199 lb 6.4 oz (90.4 kg)   SpO2 97%   BMI 28.61 kg/m?  ?Wt Readings from Last 3 Encounters:  ?05/10/21 199 lb 6.4 oz (90.4 kg)  ?04/22/21 190 lb (86.2 kg)  ?03/25/21 191 lb (86.6 kg) (88 %, Z= 1.16)*  ? ?* Growth percentiles are based on CDC (Boys, 2-20 Years) data.  ? ? ?Health Maintenance Due  ?Topic Date Due  ? COVID-19 Vaccine (3 - Booster for Pfizer series) 09/14/2019   ? ? ?There are no preventive care reminders to display for this patient. ? ? ?Lab Results  ?Component Value Date  ? TSH 2.93 04/24/2017  ? ?  Lab Results  ?Component Value Date  ? WBC 10.2 09/23/2020  ? HGB 15.9 09/23/2020  ? HCT 46.3 09/23/2020  ? MCV 89.6 09/23/2020  ? PLT 286 09/23/2020  ? ?Lab Results  ?Component Value Date  ? NA 138 09/23/2020  ? K 3.4 (L) 09/23/2020  ? CO2 26 09/23/2020  ? GLUCOSE 106 (H) 09/23/2020  ? BUN 11 09/23/2020  ? CREATININE 1.03 09/23/2020  ? BILITOT 0.3 09/23/2020  ? ALKPHOS 102 09/23/2020  ? AST 26 09/23/2020  ? ALT 45 (H) 09/23/2020  ? PROT 8.5 (H) 09/23/2020  ? ALBUMIN 4.8 09/23/2020  ? CALCIUM 9.6 09/23/2020  ? ANIONGAP 9 09/23/2020  ? ?No results found for: CHOL ?No results found for: HDL ?No results found for: LDLCALC ?No results found for: TRIG ?No results found for: CHOLHDL ?No results found for: HGBA1C ? ?   ?Assessment & Plan:  ? ?Problem List Items Addressed This Visit   ?None ?Visit Diagnoses   ? ? Pain due to dental caries    -  Primary  ? Large carie to R lower tooth with possible abscess. Start augmentin BID x7 days. Orajel, tylenol, and ibuprofen prn pain. F/U with dentist asap.   ? ?  ? ? ? ?Meds ordered this encounter  ?Medications  ? amoxicillin-clavulanate (AUGMENTIN) 875-125 MG tablet  ?  Sig: Take 1 tablet by mouth 2 (two) times daily.  ?  Dispense:  14 tablet  ?  Refill:  0  ? ? ? ?Gerre Scull, NP ? ?

## 2021-05-10 NOTE — Patient Instructions (Signed)
It was great to see you! ? ?Start augmentin twice a day for 7 days. You can use orajel to help with the pain, along with ibuprofen and tylenol as needed. Schedule an appointment with a dentist as soon as possible.  ? ?Let's follow-up if you have any concerns.  ? ?Take care, ? ?Vance Peper, NP ? ?

## 2021-05-17 NOTE — Progress Notes (Signed)
? [name] D.Judd Gaudier ?[facility] ?[address] [place] [address] ?Phone: [phone] ?  ?Assessment and Plan:   ?  ?1. Chronic bilateral low back pain without sciatica ?2. Somatic dysfunction of thoracic region ?3. Somatic dysfunction of lumbar region ?4. Somatic dysfunction of pelvic region ?-Chronic with exacerbation, subsequent visit ?- Recurrence of multiple musculoskeletal complaints with most prominent being in hips bilaterally and upper thoracic spine bilaterally without red flag symptoms ?- Patient has received significant relief with OMT in the past.  Elects for repeat OMT today.  Tolerated well per note below. ?- Decision today to treat with OMT was based on Physical Exam ?  ?After verbal consent patient was treated with HVLA (high velocity low amplitude), ME (muscle energy), FPR (flex positional release), ST (soft tissue), PC/PD (Pelvic Compression/ Pelvic Decompression) techniques in  thoracic, lumbar, and pelvic areas. Patient tolerated the procedure well with improvement in symptoms.  Patient educated on potential side effects of soreness and recommended to rest, hydrate, and use Tylenol as needed for pain control. ?  ?Pertinent previous records reviewed include none ?  ?Follow Up: 4 weeks for repeat OMT maintenance.  4 weeks appears to be the best length of time for repeat OMT for patient to prevent worsening symptoms ?  ?Subjective:   ?I, Jerene Canny, am serving as a Neurosurgeon for Doctor [employer] ? ?Chief Complaint: OMT follow up  ? ?HPI:  ?01/22/21 ?Patient is a 21 year old male presenting with neck and back pain. Patient last saw [name] on 11/15/2020 for this reason and had OMT. Today patient states that he is here for maintenance OMT.   ?  ?02/19/2021 ?Patient states after the last OMT visit the following week the neck got tight after laying on a friends bed gave him a crick in his neck for the next two days , back and neck still feel tight  ?   ?03/25/2021 ?Patient states that he's feeling pretty good pain in his right shoulder blade.  States that he has felt good generally for about 4 weeks in between appointments, however going longer than 4 weeks he notices that pain started to return.  No new trauma or symptoms. ?  ?04/22/2021 ?Patient states that his hips have been hurting him the last couple of days, R shoulder has been giving him some problems 04/03/2021.  ? ?05/20/2021 ?Patient states that hes doing good has some pain in his shoulders and hips  ? ? ? ?Relevant Historical Information: None pertinent ? ?Additional pertinent review of systems negative. ? ?Current Outpatient Medications  ?Medication Sig Dispense Refill  ? amoxicillin-clavulanate (AUGMENTIN) 875-125 MG tablet Take 1 tablet by mouth 2 (two) times daily. 14 tablet 0  ? diclofenac (VOLTAREN) 75 MG EC tablet Take 1 tablet (75 mg total) by mouth 2 (two) times daily as needed. 30 tablet 5  ? hydrOXYzine (VISTARIL) 25 MG capsule Take 1 capsule (25 mg total) by mouth 2 (two) times daily as needed. 180 capsule 1  ? levocetirizine (XYZAL) 5 MG tablet Take 1 tablet (5 mg total) by mouth every evening. 90 tablet 1  ? montelukast (SINGULAIR) 10 MG tablet Take 1 tablet (10 mg total) by mouth at bedtime. 90 tablet 1  ? ondansetron (ZOFRAN) 8 MG tablet Take 1 tablet (8 mg total) by mouth every 8 (eight) hours as needed for nausea or vomiting. 20 tablet 0  ? pantoprazole (PROTONIX) 20 MG tablet Take 1 tablet (20 mg total) by mouth daily  as needed (Acid Reflux). 90 tablet 1  ? propranolol (INDERAL) 20 MG tablet Take 1 tablet (20 mg total) by mouth 2 (two) times daily. 180 tablet 1  ? QUEtiapine (SEROQUEL) 100 MG tablet Take 0.5 tablets (50 mg total) by mouth at bedtime. 45 tablet 1  ? tiZANidine (ZANAFLEX) 2 MG tablet Take 1 tablet (2 mg total) by mouth at bedtime. (Patient taking differently: Take 2 mg by mouth at bedtime as needed.) 30 tablet 0  ? venlafaxine XR (EFFEXOR XR) 75 MG 24 hr capsule Take 1  capsule (75 mg total) by mouth daily with breakfast. 90 capsule 1  ? ?No current facility-administered medications for this visit.  ?  ?  ?Objective:   ?  ?Vitals:  ? 05/20/21 1502  ?BP: 118/78  ?Pulse: 64  ?SpO2: 97%  ?Weight: 196 lb (88.9 kg)  ?Height: 5\' 10"  (1.778 m)  ?  ?  ?Body mass index is 28.12 kg/m?.  ?  ?Physical Exam:   ?  ?General: Well-appearing, cooperative, sitting comfortably in no acute distress.  ? ?OMT Physical Exam: ? ?ASIS Compression Test: Positive Right ?  ?Thoracic: TTP paraspinal, T4-7 RRSL ?Lumbar: TTP paraspinal, L1-3 RRSL ?Pelvis: Right anterior innominate ? ?Electronically signed by:  ? D.[name] ?[facility] ?3:50 PM 05/20/21 ?

## 2021-05-20 ENCOUNTER — Ambulatory Visit (INDEPENDENT_AMBULATORY_CARE_PROVIDER_SITE_OTHER): Payer: 59 | Admitting: Sports Medicine

## 2021-05-20 ENCOUNTER — Other Ambulatory Visit: Payer: Self-pay

## 2021-05-20 VITALS — BP 118/78 | HR 64 | Ht 70.0 in | Wt 196.0 lb

## 2021-05-20 DIAGNOSIS — M9902 Segmental and somatic dysfunction of thoracic region: Secondary | ICD-10-CM | POA: Diagnosis not present

## 2021-05-20 DIAGNOSIS — G8929 Other chronic pain: Secondary | ICD-10-CM | POA: Diagnosis not present

## 2021-05-20 DIAGNOSIS — M9905 Segmental and somatic dysfunction of pelvic region: Secondary | ICD-10-CM | POA: Diagnosis not present

## 2021-05-20 DIAGNOSIS — M9903 Segmental and somatic dysfunction of lumbar region: Secondary | ICD-10-CM | POA: Diagnosis not present

## 2021-05-20 DIAGNOSIS — M545 Low back pain, unspecified: Secondary | ICD-10-CM | POA: Diagnosis not present

## 2021-05-20 NOTE — Patient Instructions (Addendum)
Good to see you   

## 2021-06-17 ENCOUNTER — Ambulatory Visit: Payer: 59 | Admitting: Sports Medicine

## 2021-06-17 NOTE — Progress Notes (Deleted)
? [name] D.Judd Gaudier ?[facility] ?[address] [place] [address] ?Phone: [phone] ?  ?Assessment and Plan:   ?  ?There are no diagnoses linked to this encounter.  ?*** ?- Patient has received significant relief with OMT in the past.  Elects for repeat OMT today.  Tolerated well per note below. ?- Decision today to treat with OMT was based on Physical Exam ?  ?After verbal consent patient was treated with HVLA (high velocity low amplitude), ME (muscle energy), FPR (flex positional release), ST (soft tissue), PC/PD (Pelvic Compression/ Pelvic Decompression) techniques in cervical, rib, thoracic, lumbar, and pelvic areas. Patient tolerated the procedure well with improvement in symptoms.  Patient educated on potential side effects of soreness and recommended to rest, hydrate, and use Tylenol as needed for pain control. ?  ?Pertinent previous records reviewed include *** ?  ?Follow Up: ***  ? ?  ?Subjective:   ?I, Jerene Canny, am serving as a Neurosurgeon for Doctor [employer] ? ?Chief Complaint: OMT follow up  ?  ?HPI:  ?01/22/21 ?Patient is a 21 year old male presenting with neck and back pain. Patient last saw [name] on 11/15/2020 for this reason and had OMT. Today patient states that he is here for maintenance OMT.   ?  ?02/19/2021 ?Patient states after the last OMT visit the following week the neck got tight after laying on a friends bed gave him a crick in his neck for the next two days , back and neck still feel tight  ?  ?03/25/2021 ?Patient states that he's feeling pretty good pain in his right shoulder blade.  States that he has felt good generally for about 4 weeks in between appointments, however going longer than 4 weeks he notices that pain started to return.  No new trauma or symptoms. ?  ?04/22/2021 ?Patient states that his hips have been hurting him the last couple of days, R shoulder has been giving him some problems 04/03/2021.  ?  ?05/20/2021 ?Patient states that hes  doing good has some pain in his shoulders and hips  ?  ? 06/17/2021 ?Patient states ?  ?Relevant Historical Information: None pertinent ?  ?Additional pertinent review of systems negative. ? ?Current Outpatient Medications  ?Medication Sig Dispense Refill  ? amoxicillin-clavulanate (AUGMENTIN) 875-125 MG tablet Take 1 tablet by mouth 2 (two) times daily. 14 tablet 0  ? diclofenac (VOLTAREN) 75 MG EC tablet Take 1 tablet (75 mg total) by mouth 2 (two) times daily as needed. 30 tablet 5  ? hydrOXYzine (VISTARIL) 25 MG capsule Take 1 capsule (25 mg total) by mouth 2 (two) times daily as needed. 180 capsule 1  ? levocetirizine (XYZAL) 5 MG tablet Take 1 tablet (5 mg total) by mouth every evening. 90 tablet 1  ? montelukast (SINGULAIR) 10 MG tablet Take 1 tablet (10 mg total) by mouth at bedtime. 90 tablet 1  ? ondansetron (ZOFRAN) 8 MG tablet Take 1 tablet (8 mg total) by mouth every 8 (eight) hours as needed for nausea or vomiting. 20 tablet 0  ? pantoprazole (PROTONIX) 20 MG tablet Take 1 tablet (20 mg total) by mouth daily as needed (Acid Reflux). 90 tablet 1  ? propranolol (INDERAL) 20 MG tablet Take 1 tablet (20 mg total) by mouth 2 (two) times daily. 180 tablet 1  ? QUEtiapine (SEROQUEL) 100 MG tablet Take 0.5 tablets (50 mg total) by mouth at bedtime. 45 tablet 1  ? tiZANidine (ZANAFLEX) 2 MG tablet Take  1 tablet (2 mg total) by mouth at bedtime. (Patient taking differently: Take 2 mg by mouth at bedtime as needed.) 30 tablet 0  ? venlafaxine XR (EFFEXOR XR) 75 MG 24 hr capsule Take 1 capsule (75 mg total) by mouth daily with breakfast. 90 capsule 1  ? ?No current facility-administered medications for this visit.  ?  ?  ?Objective:   ?  ?There were no vitals filed for this visit.  ?  ?There is no height or weight on file to calculate BMI.  ?  ?Physical Exam:   ?  ?General: Well-appearing, cooperative, sitting comfortably in no acute distress.  ? ?OMT Physical Exam: ? ?ASIS Compression Test: Positive  Right ?Cervical: TTP paraspinal, *** ?Rib: Bilateral elevated first rib with TTP ?Thoracic: TTP paraspinal,*** ?Lumbar: TTP paraspinal,*** ?Pelvis: Right anterior innominate ? ?Electronically signed by:  ?[name] D.Judd Gaudier ? [facility] ?11:24 AM 06/17/21 ?

## 2021-06-20 ENCOUNTER — Ambulatory Visit (INDEPENDENT_AMBULATORY_CARE_PROVIDER_SITE_OTHER): Payer: 59 | Admitting: Sports Medicine

## 2021-06-20 VITALS — BP 122/82 | HR 89 | Ht 70.0 in | Wt 200.0 lb

## 2021-06-20 DIAGNOSIS — M9905 Segmental and somatic dysfunction of pelvic region: Secondary | ICD-10-CM

## 2021-06-20 DIAGNOSIS — M545 Low back pain, unspecified: Secondary | ICD-10-CM | POA: Diagnosis not present

## 2021-06-20 DIAGNOSIS — M9901 Segmental and somatic dysfunction of cervical region: Secondary | ICD-10-CM

## 2021-06-20 DIAGNOSIS — M9903 Segmental and somatic dysfunction of lumbar region: Secondary | ICD-10-CM | POA: Diagnosis not present

## 2021-06-20 DIAGNOSIS — G8929 Other chronic pain: Secondary | ICD-10-CM

## 2021-06-20 DIAGNOSIS — M542 Cervicalgia: Secondary | ICD-10-CM | POA: Diagnosis not present

## 2021-06-20 DIAGNOSIS — M9908 Segmental and somatic dysfunction of rib cage: Secondary | ICD-10-CM

## 2021-06-20 DIAGNOSIS — M9902 Segmental and somatic dysfunction of thoracic region: Secondary | ICD-10-CM

## 2021-06-20 NOTE — Progress Notes (Signed)
? Stephen Sexton ?[facility] ?[place] ?Phone: [phone] ?  ?Assessment and Plan:   ?  ?1. Chronic bilateral low back pain without sciatica ?2. Neck pain ?3. Somatic dysfunction of thoracic region ?4. Somatic dysfunction of lumbar region ?5. Somatic dysfunction of pelvic region ?6. Somatic dysfunction of cervical region ?7. Somatic dysfunction of rib region ?-Chronic with exacerbation, subsequent visit ?- Recurrent multiple musculoskeletal complaints with most prominent being neck and low back ?- Patient has received significant relief with OMT in the past.  Elects for repeat OMT today.  Tolerated well per note below. ?- Decision today to treat with OMT was based on Physical Exam ?  ?After verbal consent patient was treated with HVLA (high velocity low amplitude), ME (muscle energy), FPR (flex positional release), ST (soft tissue), PC/PD (Pelvic Compression/ Pelvic Decompression) techniques in cervical, rib, thoracic, lumbar, and pelvic areas. Patient tolerated the procedure well with improvement in symptoms.  Patient educated on potential side effects of soreness and recommended to rest, hydrate, and use Tylenol as needed for pain control. ?  ?Pertinent previous records reviewed include none ?  ?Follow Up: 4 weeks for repeat OMT maintenance ?  ?Subjective:   ?I, Pincus Badder, am serving as a Education administrator for Doctor [employer] ? ?Chief Complaint: OMT follow up  ?  ?HPI:  ?01/22/21 ?Patient is a 21 year old male presenting with neck and back pain. Patient last saw Dr. Tamala Julian on 11/15/2020 for this reason and had OMT. Today patient states that he is here for maintenance OMT.   ?  ?02/19/2021 ?Patient states after the last OMT visit the following week the neck got tight after laying on a friends bed gave him a crick in his neck for the next two days , back and neck still feel tight  ?  ?03/25/2021 ?Patient states that he's feeling pretty good pain in his right  shoulder blade.  States that he has felt good generally for about 4 weeks in between appointments, however going longer than 4 weeks he notices that pain started to return.  No new trauma or symptoms. ?  ?04/22/2021 ?Patient states that his hips have been hurting him the last couple of days, R shoulder has been giving him some problems 04/03/2021.  ?  ?05/20/2021 ?Patient states that hes doing good has some pain in his shoulders and hips  ? ?06/20/2021 ?Patient states that hes doing good ? ?Relevant Historical Information: None pertinent ? ?Additional pertinent review of systems negative. ? ?Current Outpatient Medications  ?Medication Sig Dispense Refill  ? amoxicillin-clavulanate (AUGMENTIN) 875-125 MG tablet Take 1 tablet by mouth 2 (two) times daily. 14 tablet 0  ? diclofenac (VOLTAREN) 75 MG EC tablet Take 1 tablet (75 mg total) by mouth 2 (two) times daily as needed. 30 tablet 5  ? hydrOXYzine (VISTARIL) 25 MG capsule Take 1 capsule (25 mg total) by mouth 2 (two) times daily as needed. 180 capsule 1  ? levocetirizine (XYZAL) 5 MG tablet Take 1 tablet (5 mg total) by mouth every evening. 90 tablet 1  ? montelukast (SINGULAIR) 10 MG tablet Take 1 tablet (10 mg total) by mouth at bedtime. 90 tablet 1  ? ondansetron (ZOFRAN) 8 MG tablet Take 1 tablet (8 mg total) by mouth every 8 (eight) hours as needed for nausea or vomiting. 20 tablet 0  ? pantoprazole (PROTONIX) 20 MG tablet Take 1 tablet (20 mg total) by mouth daily as needed (Acid Reflux). Imogene  tablet 1  ? propranolol (INDERAL) 20 MG tablet Take 1 tablet (20 mg total) by mouth 2 (two) times daily. 180 tablet 1  ? QUEtiapine (SEROQUEL) 100 MG tablet Take 0.5 tablets (50 mg total) by mouth at bedtime. 45 tablet 1  ? tiZANidine (ZANAFLEX) 2 MG tablet Take 1 tablet (2 mg total) by mouth at bedtime. (Patient taking differently: Take 2 mg by mouth at bedtime as needed.) 30 tablet 0  ? venlafaxine XR (EFFEXOR XR) 75 MG 24 hr capsule Take 1 capsule (75 mg total) by mouth  daily with breakfast. 90 capsule 1  ? ?No current facility-administered medications for this visit.  ?  ?  ?Objective:   ?  ?Vitals:  ? 06/20/21 1242  ?BP: 122/82  ?Pulse: 89  ?SpO2: 98%  ?Weight: 200 lb (90.7 kg)  ?Height: 5\' 10"  (1.778 m)  ?  ?  ?Body mass index is 28.7 kg/m?.  ?  ?Physical Exam:   ?  ?General: Well-appearing, cooperative, sitting comfortably in no acute distress.  ? ?OMT Physical Exam: ? ?ASIS Compression Test: Positive Right ?Cervical: TTP paraspinal, C5 RLSL ?Rib: Bilateral elevated first rib with nTTP ?Thoracic: TTP paraspinal, C2-4 RRSL ?Lumbar: TTP paraspinal, L2 RLSL ?Pelvis: Right anterior innominate ? ?Electronically signed by:  ?Stephen Sexton ?[facility] ?12:58 PM 06/20/21 ?

## 2021-06-20 NOTE — Patient Instructions (Addendum)
Good to see you  °4 week follow up for repeat OMT °

## 2021-06-22 ENCOUNTER — Encounter (HOSPITAL_COMMUNITY): Payer: Self-pay | Admitting: *Deleted

## 2021-06-22 ENCOUNTER — Emergency Department (HOSPITAL_COMMUNITY): Payer: 59

## 2021-06-22 ENCOUNTER — Other Ambulatory Visit: Payer: Self-pay

## 2021-06-22 ENCOUNTER — Emergency Department (HOSPITAL_COMMUNITY)
Admission: EM | Admit: 2021-06-22 | Discharge: 2021-06-22 | Disposition: A | Discharge status: Home / Self Care | Payer: 59 | Attending: Emergency Medicine | Admitting: Emergency Medicine

## 2021-06-22 DIAGNOSIS — N50812 Left testicular pain: Secondary | ICD-10-CM | POA: Diagnosis present

## 2021-06-22 LAB — URINALYSIS, ROUTINE W REFLEX MICROSCOPIC
Bilirubin Urine: NEGATIVE
Glucose, UA: NEGATIVE mg/dL
Hgb urine dipstick: NEGATIVE
Ketones, ur: NEGATIVE mg/dL
Leukocytes,Ua: NEGATIVE
Nitrite: NEGATIVE
Protein, ur: NEGATIVE mg/dL
Specific Gravity, Urine: 1.023 (ref 1.005–1.030)
pH: 5 (ref 5.0–8.0)

## 2021-06-22 MED ORDER — ONDANSETRON 4 MG PO TBDP
4.0000 mg | ORAL_TABLET | Freq: Once | ORAL | Status: AC
  Administered 2021-06-22: 4 mg via ORAL
  Filled 2021-06-22: qty 1

## 2021-06-22 MED ORDER — HYDROCODONE-ACETAMINOPHEN 5-325 MG PO TABS
1.0000 | ORAL_TABLET | Freq: Once | ORAL | Status: AC
  Administered 2021-06-22: 1 via ORAL
  Filled 2021-06-22: qty 1

## 2021-06-22 NOTE — Discharge Instructions (Signed)
Follow these instructions at home: ?Monitor the scrotum for any changes. ?Keep all follow-up visits. This is important. ?Get help right away if: ?The scrotum swells, becomes discolored, or appears bruised. ?There is severe pain in the scrotal area. ? ?Managing pain  ?Take over the counter medications for pain such as tylenol or advil ?Wear supportive underwear. ?Elevate your scrotum and apply ice as directed. To do this: ?Put ice in a plastic bag. ?Place a small towel or pillow between your legs. ?Rest your scrotum on the pillow or towel. ?Place another towel between your skin and the plastic bag. ?Leave the ice on for 20 minutes, 2-3 times a day. ?Remove the ice if your skin turns bright red. This is very important. If you cannot feel pain, heat, or cold, you have a greater risk of damage to the area. ?

## 2021-06-22 NOTE — ED Triage Notes (Signed)
Left testicular pain started this am, he states the pain is getting worse and tender. ?

## 2021-06-22 NOTE — ED Provider Notes (Signed)
?[facility]-EMERGENCY DEPT ?Provider Note ? ? ?CSN: [id number] ?Arrival date & time: 06/22/21  1048 ? ?  ? ?History ? ?Chief Complaint  ?Patient presents with  ? Testicle Pain  ? ? ?Stephen Sexton is a 21 y.o. male. ? ?21 year old male who presents emergency department chief complaint of left testicle pain.  Patient states that when he woke up this morning he had severe pain in his left testicle which is due.  Patient states that he feels like the pain radiates up into his stomach.  He denies flank pain.  He feels like his left testicle sitting higher than the right which is abnormal.  He denies any recent unprotected intercourse, penile discharge, urinary symptoms.  He has never had anything like this before.  Pain is worse with ambulation and movement. ? ?The history is provided by the patient. No language interpreter was used.  ?Testicle Pain ?This is a new problem. The current episode started 1 to 2 hours ago. The problem occurs constantly. The problem has been rapidly worsening. Pertinent negatives include no chest pain, no abdominal pain, no headaches and no shortness of breath. The symptoms are aggravated by standing and walking. Nothing relieves the symptoms.  ? ?  ? ?Home Medications ?Prior to Admission medications   ?Medication Sig Start Date End Date Taking? Authorizing Provider  ?amoxicillin-clavulanate (AUGMENTIN) 875-125 MG tablet Take 1 tablet by mouth 2 (two) times daily. 05/10/21   McElwee, Jake Church, NP  ?diclofenac (VOLTAREN) 75 MG EC tablet Take 1 tablet (75 mg total) by mouth 2 (two) times daily as needed. 01/08/21   Kuneff, Renee A, DO  ?hydrOXYzine (VISTARIL) 25 MG capsule Take 1 capsule (25 mg total) by mouth 2 (two) times daily as needed. 01/08/21   Kuneff, Renee A, DO  ?levocetirizine (XYZAL) 5 MG tablet Take 1 tablet (5 mg total) by mouth every evening. 01/08/21   Kuneff, Renee A, DO  ?montelukast (SINGULAIR) 10 MG tablet Take 1 tablet (10 mg total) by mouth at  bedtime. 01/08/21   Kuneff, Renee A, DO  ?ondansetron (ZOFRAN) 8 MG tablet Take 1 tablet (8 mg total) by mouth every 8 (eight) hours as needed for nausea or vomiting. 09/23/20   Mancel Bale, MD  ?pantoprazole (PROTONIX) 20 MG tablet Take 1 tablet (20 mg total) by mouth daily as needed (Acid Reflux). 01/08/21   Kuneff, Renee A, DO  ?propranolol (INDERAL) 20 MG tablet Take 1 tablet (20 mg total) by mouth 2 (two) times daily. 01/08/21   Felix Pacini A, DO  ?QUEtiapine (SEROQUEL) 100 MG tablet Take 0.5 tablets (50 mg total) by mouth at bedtime. 01/08/21   Kuneff, Renee A, DO  ?tiZANidine (ZANAFLEX) 2 MG tablet Take 1 tablet (2 mg total) by mouth at bedtime. ?Patient taking differently: Take 2 mg by mouth at bedtime as needed. 10/18/20   Judi Saa, DO  ?venlafaxine XR (EFFEXOR XR) 75 MG 24 hr capsule Take 1 capsule (75 mg total) by mouth daily with breakfast. 01/08/21   Claiborne Billings, Renee A, DO  ?   ? ?Allergies    ?Amitriptyline   ? ?Review of Systems   ?Review of Systems  ?Respiratory:  Negative for shortness of breath.   ?Cardiovascular:  Negative for chest pain.  ?Gastrointestinal:  Negative for abdominal pain.  ?Genitourinary:  Positive for testicular pain.  ?Neurological:  Negative for headaches.  ? ?Physical Exam ?Updated Vital Signs ?BP 136/75 (BP Location: Left Arm)   Pulse (!) 107   Temp  98.3 ?F (36.8 ?C) (Oral)   Resp 18   Ht 5\' 10"  (1.778 m)   Wt 90.7 kg   SpO2 98%   BMI 28.70 kg/m?  ?Physical Exam ?Vitals and nursing note reviewed. Exam conducted with a chaperone present.  ?Constitutional:   ?   General: He is not in acute distress. ?   Appearance: He is well-developed. He is not diaphoretic.  ?HENT:  ?   Head: Normocephalic and atraumatic.  ?Eyes:  ?   General: No scleral icterus. ?   Conjunctiva/sclera: Conjunctivae normal.  ?Cardiovascular:  ?   Rate and Rhythm: Normal rate and regular rhythm.  ?   Heart sounds: Normal heart sounds.  ?Pulmonary:  ?   Effort: Pulmonary effort is normal. No  respiratory distress.  ?   Breath sounds: Normal breath sounds.  ?Abdominal:  ?   Palpations: Abdomen is soft.  ?   Tenderness: There is no abdominal tenderness.  ?Genitourinary: ?   Penis: Circumcised.   ?   Testes:     ?   Right: Mass, tenderness or swelling not present.     ?   Left: Tenderness present. Swelling not present.  ?Musculoskeletal:  ?   Cervical back: Normal range of motion and neck supple.  ?Skin: ?   General: Skin is warm and dry.  ?Neurological:  ?   Mental Status: He is alert.  ?Psychiatric:     ?   Behavior: Behavior normal.  ? ? ?ED Results / Procedures / Treatments   ?Labs ?(all labs ordered are listed, but only abnormal results are displayed) ?Labs Reviewed - No data to display ? ?EKG ?None ? ?Radiology ?No results found. ? ?Procedures ?Procedures  ? ? ?Medications Ordered in ED ?Medications - No data to display ? ?ED Course/ Medical Decision Making/ A&P ?  ?                        ?Medical Decision Making ?Patient here with Left testicle pain. ?The differential diagnosis of Emergent testicle pain includes but is not limited to Cellulitis, Fournier's gangrene, epididymitis, orchitis, testicular torsion, appendage torsion, trauma, indirect hernia. ? ?Urine has no crystals or blood concerning for referred pain from ureteral colic. No evidence of hernia, torsion, orchitis or epididymitis. Will dc with supportive care and urology f/u if needed. Discussed return precautions ? ?Amount and/or Complexity of Data Reviewed ?Labs: ordered. ?Radiology: ordered and independent interpretation performed. ?   Details: I personally visualized the images and agree with radiologic interpretation. ? ?Risk ?Prescription drug management. ? ? ? ?Final Clinical Impression(s) / ED Diagnoses ?Final diagnoses:  ?None  ? ? ?Rx / DC Orders ?ED Discharge Orders   ? ? None  ? ?  ? ? ?  ? , PA-C ?06/22/21 1635 ? ?  ?06/24/21, MD ?06/26/21 1404 ? ?

## 2021-06-24 LAB — GC/CHLAMYDIA PROBE AMP (~~LOC~~) NOT AT ARMC
Chlamydia: NEGATIVE
Comment: NEGATIVE
Comment: NORMAL
Neisseria Gonorrhea: NEGATIVE

## 2021-07-03 NOTE — Progress Notes (Signed)
0 ? [name] D.Judd Gaudier ?[facility] ?[address] [place] [address] ?Phone: [phone] ?  ?Assessment and Plan:   ?  ?1. 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 Neck pain ?3. Somatic dysfunction of thoracic region ?4. Somatic dysfunction of lumbar region ?5. Somatic dysfunction of pelvic region ?6. Somatic dysfunction of cervical region ?7. Somatic dysfunction of rib region ?-Chronic with exacerbation, subsequent visit ?-Acute flare of low back pain for the past 2 days with radicular symptoms ?- Patient has received significant relief with OMT in the past.  Elects for repeat OMT today.  Tolerated well per note below. ?- Decision today to treat with OMT was based on Physical Exam ?  ?After verbal consent patient was treated with HVLA (high velocity low amplitude), ME (muscle energy), FPR (flex positional release), ST (soft tissue), PC/PD (Pelvic Compression/ Pelvic Decompression) techniques in cervical, rib, thoracic, lumbar, and pelvic areas. Patient tolerated the procedure well with improvement in symptoms.  Patient educated on potential side effects of soreness and recommended to rest, hydrate, and use Tylenol as needed for pain control. ?  ?Pertinent previous records reviewed include ER note from 06/22/2021 ?  ?Follow Up: Patient may keep OMT appointment in 2 weeks for reevaluation and repeat OMT ?  ?Subjective:   ?I, Jerene Canny, am serving as a Neurosurgeon for Doctor [employer] ? ?Chief Complaint: OMT follow up  ?  ?HPI:  ?01/22/21 ?Patient is a 21 year old male presenting with neck and back pain. Patient last saw [name] on 11/15/2020 for this reason and had OMT. Today patient states that he is here for maintenance OMT.   ?  ?02/19/2021 ?Patient states after the last OMT visit the following week the neck got tight after laying on a friends bed gave him a crick in his neck for the next two days , back and neck still feel tight  ?  ?03/25/2021 ?Patient states that  he's feeling pretty good pain in his right shoulder blade.  States that he has felt good generally for about 4 weeks in between appointments, however going longer than 4 weeks he notices that pain started to return.  No new trauma or symptoms. ?  ?04/22/2021 ?Patient states that his hips have been hurting him the last couple of days, R shoulder has been giving him some problems 04/03/2021.  ?  ?05/20/2021 ?Patient states that hes doing good has some pain in his shoulders and hips  ?  ?06/20/2021 ?Patient states that hes doing good ? ?07/04/2021 ?Patient states that his back has been hurting and its radiating down to his hip and down his leg , no numbness tingling ? ? ?Relevant Historical Information: None pertinent ? ?Additional pertinent review of systems negative. ? ?Current Outpatient Medications  ?Medication Sig Dispense Refill  ? amoxicillin-clavulanate (AUGMENTIN) 875-125 MG tablet Take 1 tablet by mouth 2 (two) times daily. 14 tablet 0  ? diclofenac (VOLTAREN) 75 MG EC tablet Take 1 tablet (75 mg total) by mouth 2 (two) times daily as needed. 30 tablet 5  ? hydrOXYzine (VISTARIL) 25 MG capsule Take 1 capsule (25 mg total) by mouth 2 (two) times daily as needed. 180 capsule 1  ? levocetirizine (XYZAL) 5 MG tablet Take 1 tablet (5 mg total) by mouth every evening. 90 tablet 1  ? montelukast (SINGULAIR) 10 MG tablet Take 1 tablet (10 mg total) by mouth at bedtime. 90 tablet 1  ? ondansetron (ZOFRAN) 8 MG tablet Take 1  tablet (8 mg total) by mouth every 8 (eight) hours as needed for nausea or vomiting. 20 tablet 0  ? pantoprazole (PROTONIX) 20 MG tablet Take 1 tablet (20 mg total) by mouth daily as needed (Acid Reflux). 90 tablet 1  ? propranolol (INDERAL) 20 MG tablet Take 1 tablet (20 mg total) by mouth 2 (two) times daily. 180 tablet 1  ? QUEtiapine (SEROQUEL) 100 MG tablet Take 0.5 tablets (50 mg total) by mouth at bedtime. 45 tablet 1  ? tiZANidine (ZANAFLEX) 2 MG tablet Take 1 tablet (2 mg total) by mouth at  bedtime. (Patient taking differently: Take 2 mg by mouth at bedtime as needed.) 30 tablet 0  ? venlafaxine XR (EFFEXOR XR) 75 MG 24 hr capsule Take 1 capsule (75 mg total) by mouth daily with breakfast. 90 capsule 1  ? ?No current facility-administered medications for this visit.  ?  ?  ?Objective:   ?  ?Vitals:  ? 07/04/21 1432  ?BP: 112/72  ?Pulse: 91  ?SpO2: 99%  ?Weight: 204 lb (92.5 kg)  ?Height: 5\' 10"  (1.778 m)  ?  ?  ?Body mass index is 29.27 kg/m?.  ?  ?Physical Exam:   ?  ?General: Well-appearing, cooperative, sitting comfortably in no acute distress.  ? ?OMT Physical Exam: ? ?ASIS Compression Test: Positive Right ?Cervical: TTP paraspinal, C3-5 RRSR ?Rib: Bilateral elevated first rib with nTTP ?Thoracic: TTP paraspinal, minimal rotation through thoracic spine ?Lumbar: TTP paraspinal, L1-3 RRSL ?Pelvis: Right anterior innominate ? ?Electronically signed by:  ? D.[name] ?[facility] ?2:47 PM 07/04/21 ?

## 2021-07-04 ENCOUNTER — Ambulatory Visit (INDEPENDENT_AMBULATORY_CARE_PROVIDER_SITE_OTHER): Payer: 59 | Admitting: Sports Medicine

## 2021-07-04 VITALS — BP 112/72 | HR 91 | Ht 70.0 in | Wt 204.0 lb

## 2021-07-04 DIAGNOSIS — M9901 Segmental and somatic dysfunction of cervical region: Secondary | ICD-10-CM

## 2021-07-04 DIAGNOSIS — M9903 Segmental and somatic dysfunction of lumbar region: Secondary | ICD-10-CM

## 2021-07-04 DIAGNOSIS — M9902 Segmental and somatic dysfunction of thoracic region: Secondary | ICD-10-CM | POA: Diagnosis not present

## 2021-07-04 DIAGNOSIS — M9905 Segmental and somatic dysfunction of pelvic region: Secondary | ICD-10-CM | POA: Diagnosis not present

## 2021-07-04 DIAGNOSIS — M545 Low back pain, unspecified: Secondary | ICD-10-CM | POA: Diagnosis not present

## 2021-07-04 DIAGNOSIS — M542 Cervicalgia: Secondary | ICD-10-CM

## 2021-07-04 DIAGNOSIS — M5442 Lumbago with sciatica, left side: Secondary | ICD-10-CM | POA: Diagnosis not present

## 2021-07-04 DIAGNOSIS — M9908 Segmental and somatic dysfunction of rib cage: Secondary | ICD-10-CM

## 2021-07-04 DIAGNOSIS — G8929 Other chronic pain: Secondary | ICD-10-CM

## 2021-07-04 NOTE — Patient Instructions (Signed)
Good to see you   

## 2021-07-17 NOTE — Progress Notes (Signed)
? [name] D.Judd Gaudier ?[facility] ?[address] [place] [address] ?Phone: [phone] ?  ?Assessment and Plan:   ?  ?1. Chronic bilateral low back pain with left-sided sciatica ?2. Somatic dysfunction of lumbar region ?3. Somatic dysfunction of pelvic region ?4. Somatic dysfunction of sacral region ?-Chronic with exacerbation, subsequent visit ?- Overall improvement in multiple musculoskeletal complaints with regular OMT, however patient has had particularly significant flare in low back and left SI joint ?- Patient has received significant relief with OMT in the past.  Elects for repeat OMT today.  Tolerated well per note below. ?- Decision today to treat with OMT was based on Physical Exam ?  ?After verbal consent patient was treated with HVLA (high velocity low amplitude), ME (muscle energy), FPR (flex positional release), ST (soft tissue), PC/PD (Pelvic Compression/ Pelvic Decompression) techniques in sacral, lumbar, and pelvic areas. Patient tolerated the procedure well with improvement in symptoms.  Patient educated on potential side effects of soreness and recommended to rest, hydrate, and use Tylenol as needed for pain control. ?  ?Pertinent previous records reviewed include none ?  ?Follow Up: 3 to 4 weeks for repeat OMT maintenance ?  ?Subjective:   ?I, Jerene Canny, am serving as a Neurosurgeon for Doctor [employer] ? ?Chief Complaint: OMT follow up  ?  ?HPI:  ?01/22/21 ?Patient is a 21 year old male presenting with neck and back pain. Patient last saw [name] on 11/15/2020 for this reason and had OMT. Today patient states that he is here for maintenance OMT.   ?  ?02/19/2021 ?Patient states after the last OMT visit the following week the neck got tight after laying on a friends bed gave him a crick in his neck for the next two days , back and neck still feel tight  ?  ?03/25/2021 ?Patient states that he's feeling pretty good pain in his right shoulder blade.  States that he  has felt good generally for about 4 weeks in between appointments, however going longer than 4 weeks he notices that pain started to return.  No new trauma or symptoms. ?  ?04/22/2021 ?Patient states that his hips have been hurting him the last couple of days, R shoulder has been giving him some problems 04/03/2021.  ?  ?05/20/2021 ?Patient states that hes doing good has some pain in his shoulders and hips  ?  ?06/20/2021 ?Patient states that hes doing good ?  ?07/04/2021 ?Patient states that his back has been hurting and its radiating down to his hip and down his leg , no numbness tingling ? ?5/11/023 ?Patient states he pulled a muscle in his back Tuesday had pain all down his leg hurts a little not as bad as it did on Tuesday  ? ?Relevant Historical Information: None pertinent ? ?Additional pertinent review of systems negative. ? ?Current Outpatient Medications  ?Medication Sig Dispense Refill  ? amoxicillin-clavulanate (AUGMENTIN) 875-125 MG tablet Take 1 tablet by mouth 2 (two) times daily. 14 tablet 0  ? diclofenac (VOLTAREN) 75 MG EC tablet Take 1 tablet (75 mg total) by mouth 2 (two) times daily as needed. 30 tablet 5  ? hydrOXYzine (VISTARIL) 25 MG capsule Take 1 capsule (25 mg total) by mouth 2 (two) times daily as needed. 180 capsule 1  ? levocetirizine (XYZAL) 5 MG tablet Take 1 tablet (5 mg total) by mouth every evening. 90 tablet 1  ? montelukast (SINGULAIR) 10 MG tablet Take 1 tablet (10 mg total) by mouth  at bedtime. 90 tablet 1  ? ondansetron (ZOFRAN) 8 MG tablet Take 1 tablet (8 mg total) by mouth every 8 (eight) hours as needed for nausea or vomiting. 20 tablet 0  ? pantoprazole (PROTONIX) 20 MG tablet Take 1 tablet (20 mg total) by mouth daily as needed (Acid Reflux). 90 tablet 1  ? propranolol (INDERAL) 20 MG tablet Take 1 tablet (20 mg total) by mouth 2 (two) times daily. 180 tablet 1  ? QUEtiapine (SEROQUEL) 100 MG tablet Take 0.5 tablets (50 mg total) by mouth at bedtime. 45 tablet 1  ? tiZANidine  (ZANAFLEX) 2 MG tablet Take 1 tablet (2 mg total) by mouth at bedtime. (Patient taking differently: Take 2 mg by mouth at bedtime as needed.) 30 tablet 0  ? venlafaxine XR (EFFEXOR XR) 75 MG 24 hr capsule Take 1 capsule (75 mg total) by mouth daily with breakfast. 90 capsule 1  ? ?No current facility-administered medications for this visit.  ?  ?  ?Objective:   ?  ?Vitals:  ? 07/18/21 1546  ?BP: 130/80  ?Pulse: 72  ?SpO2: 98%  ?Weight: 207 lb (93.9 kg)  ?Height: 5\' 10"  (1.778 m)  ?  ?  ?Body mass index is 29.7 kg/m?.  ?  ?Physical Exam:   ?  ?General: Well-appearing, cooperative, sitting comfortably in no acute distress.  ? ?OMT Physical Exam: ? ?ASIS Compression Test: Positive left ?Sacrum: Positive sphinx, TTP left sacral base ?Lumbar: TTP paraspinal, L5 RR, L1-3 RLSR ?Pelvis: Left posterior innominate ? ?Electronically signed by:  ? D.[name] ?[facility] ?4:05 PM 07/18/21 ?

## 2021-07-18 ENCOUNTER — Ambulatory Visit (INDEPENDENT_AMBULATORY_CARE_PROVIDER_SITE_OTHER): Payer: 59 | Admitting: Sports Medicine

## 2021-07-18 VITALS — BP 130/80 | HR 72 | Ht 70.0 in | Wt 207.0 lb

## 2021-07-18 DIAGNOSIS — M9903 Segmental and somatic dysfunction of lumbar region: Secondary | ICD-10-CM | POA: Diagnosis not present

## 2021-07-18 DIAGNOSIS — M9905 Segmental and somatic dysfunction of pelvic region: Secondary | ICD-10-CM | POA: Diagnosis not present

## 2021-07-18 DIAGNOSIS — M5442 Lumbago with sciatica, left side: Secondary | ICD-10-CM | POA: Diagnosis not present

## 2021-07-18 DIAGNOSIS — M9904 Segmental and somatic dysfunction of sacral region: Secondary | ICD-10-CM

## 2021-07-18 DIAGNOSIS — G8929 Other chronic pain: Secondary | ICD-10-CM

## 2021-07-18 NOTE — Patient Instructions (Signed)
Good to see you   

## 2021-08-14 NOTE — Progress Notes (Deleted)
Stephen Sexton D.[name] [address] [place] [address] Phone: [phone]   Assessment and Plan:     There are no diagnoses linked to this encounter.  *** - Patient has received significant relief with OMT in the past.  Elects for repeat OMT today.  Tolerated well per note below. - Decision today to treat with OMT was based on Physical Exam   After verbal consent patient was treated with HVLA (high velocity low amplitude), ME (muscle energy), FPR (flex positional release), ST (soft tissue), PC/PD (Pelvic Compression/ Pelvic Decompression) techniques in cervical, rib, thoracic, lumbar, and pelvic areas. Patient tolerated the procedure well with improvement in symptoms.  Patient educated on potential side effects of soreness and recommended to rest, hydrate, and use Tylenol as needed for pain control.   Pertinent previous records reviewed include ***   Follow Up: ***     Subjective:   I, [name], am serving as a Neurosurgeon for [name]  Chief Complaint: OMT follow up    HPI:  01/22/21 Patient is a 21 year old male presenting with neck and back pain. Patient last saw [name] on 11/15/2020 for this reason and had OMT. Today patient states that he is here for maintenance OMT.     02/19/2021 Patient states after the last OMT visit the following week the neck got tight after laying on a friends bed gave him a crick in his neck for the next two days , back and neck still feel tight    03/25/2021 Patient states that he's feeling pretty good pain in his right shoulder blade.  States that he has felt good generally for about 4 weeks in between appointments, however going longer than 4 weeks he notices that pain started to return.  No new trauma or symptoms.   04/22/2021 Patient states that his hips have been hurting him the last couple of days, R shoulder has been giving him some problems 04/03/2021.    05/20/2021 Patient states that hes  doing good has some pain in his shoulders and hips    06/20/2021 Patient states that hes doing good   07/04/2021 Patient states that his back has been hurting and its radiating down to his hip and down his leg , no numbness tingling   5/11/023 Patient states he pulled a muscle in his back Tuesday had pain all down his leg hurts a little not as bad as it did on Tuesday   08/15/2021 Patient states   Relevant Historical Information: ***  Additional pertinent review of systems negative.  Current Outpatient Medications  Medication Sig Dispense Refill   amoxicillin-clavulanate (AUGMENTIN) 875-125 MG tablet Take 1 tablet by mouth 2 (two) times daily. 14 tablet 0   diclofenac (VOLTAREN) 75 MG EC tablet Take 1 tablet (75 mg total) by mouth 2 (two) times daily as needed. 30 tablet 5   hydrOXYzine (VISTARIL) 25 MG capsule Take 1 capsule (25 mg total) by mouth 2 (two) times daily as needed. 180 capsule 1   levocetirizine (XYZAL) 5 MG tablet Take 1 tablet (5 mg total) by mouth every evening. 90 tablet 1   montelukast (SINGULAIR) 10 MG tablet Take 1 tablet (10 mg total) by mouth at bedtime. 90 tablet 1   ondansetron (ZOFRAN) 8 MG tablet Take 1 tablet (8 mg total) by mouth every 8 (eight) hours as needed for nausea or vomiting. 20 tablet 0   pantoprazole (PROTONIX) 20 MG tablet Take 1 tablet (20  mg total) by mouth daily as needed (Acid Reflux). 90 tablet 1   propranolol (INDERAL) 20 MG tablet Take 1 tablet (20 mg total) by mouth 2 (two) times daily. 180 tablet 1   QUEtiapine (SEROQUEL) 100 MG tablet Take 0.5 tablets (50 mg total) by mouth at bedtime. 45 tablet 1   tiZANidine (ZANAFLEX) 2 MG tablet Take 1 tablet (2 mg total) by mouth at bedtime. (Patient taking differently: Take 2 mg by mouth at bedtime as needed.) 30 tablet 0   venlafaxine XR (EFFEXOR XR) 75 MG 24 hr capsule Take 1 capsule (75 mg total) by mouth daily with breakfast. 90 capsule 1   No current facility-administered medications for this  visit.      Objective:     There were no vitals filed for this visit.    There is no height or weight on file to calculate BMI.    Physical Exam:     General: Well-appearing, cooperative, sitting comfortably in no acute distress.   OMT Physical Exam:  ASIS Compression Test: Positive Right Cervical: TTP paraspinal, *** Rib: Bilateral elevated first rib with TTP Thoracic: TTP paraspinal,*** Lumbar: TTP paraspinal,*** Pelvis: Right anterior innominate  Electronically signed by:  Stephen Sexton [name] 10:49 AM 08/14/21

## 2021-08-15 ENCOUNTER — Ambulatory Visit: Payer: 59 | Admitting: Sports Medicine

## 2021-08-15 NOTE — Progress Notes (Signed)
Stephen Sexton D.[facility] [address] [place] [address] Phone: [phone]   Assessment and Plan:     1. Chronic bilateral low back pain with left-sided sciatica 2. Somatic dysfunction of lumbar region 3. Somatic dysfunction of pelvic region 4. Somatic dysfunction of cervical region 5. Somatic dysfunction of thoracic region 6. Somatic dysfunction of rib region -Chronic with exacerbation, subsequent visit - Recurrence of multiple musculoskeletal complaints with most prominent being neck and lower back.  Overall receiving significant relief in symptoms with monthly OMT treatments - Patient has received significant relief with OMT in the past.  Elects for repeat OMT today.  Tolerated well per note below. - Decision today to treat with OMT was based on Physical Exam   After verbal consent patient was treated with HVLA (high velocity low amplitude), ME (muscle energy), FPR (flex positional release), ST (soft tissue), PC/PD (Pelvic Compression/ Pelvic Decompression) techniques in cervical, rib, thoracic, lumbar, and pelvic areas. Patient tolerated the procedure well with improvement in symptoms.  Patient educated on potential side effects of soreness and recommended to rest, hydrate, and use Tylenol as needed for pain control.   Pertinent previous records reviewed include none   Follow Up: 4 weeks for repeat OMT maintenance and continued treatment   Subjective:   I, Stephen Sexton, am serving as a scribe for Stephen Sexton  Chief Complaint: OMT follow up    HPI:  01/22/21 Patient is a 20 year old male presenting with neck and back pain. Patient last saw [name] on 11/15/2020 for this reason and had OMT. Today patient states that he is here for maintenance OMT.     02/19/2021 Patient states after the last OMT visit the following week the neck got tight after laying on a friends bed gave him a crick in his neck for the next two days , back and neck  still feel tight    03/25/2021 Patient states that he's feeling pretty good pain in his right shoulder blade.  States that he has felt good generally for about 4 weeks in between appointments, however going longer than 4 weeks he notices that pain started to return.  No new trauma or symptoms.   04/22/2021 Patient states that his hips have been hurting him the last couple of days, R shoulder has been giving him some problems 04/03/2021.    05/20/2021 Patient states that hes doing good has some pain in his shoulders and hips    06/20/2021 Patient states that hes doing good   07/04/2021 Patient states that his back has been hurting and its radiating down to his hip and down his leg , no numbness tingling   5/11/023 Patient states he pulled a muscle in his back Tuesday had pain all down his leg hurts a little not as bad as it did on Tuesday   08/16/2021 Patient states that lower back and neck is tight     Relevant Historical Information: None pertinent  Additional pertinent review of systems negative.  Current Outpatient Medications  Medication Sig Dispense Refill   diclofenac (VOLTAREN) 75 MG EC tablet Take 1 tablet (75 mg total) by mouth 2 (two) times daily as needed. 30 tablet 5   hydrOXYzine (VISTARIL) 25 MG capsule Take 1 capsule (25 mg total) by mouth 2 (two) times daily as needed. 180 capsule 1   levocetirizine (XYZAL) 5 MG tablet Take 1 tablet (5 mg total) by mouth every evening. 90 tablet 1   montelukast (  SINGULAIR) 10 MG tablet Take 1 tablet (10 mg total) by mouth at bedtime. 90 tablet 1   ondansetron (ZOFRAN) 8 MG tablet Take 1 tablet (8 mg total) by mouth every 8 (eight) hours as needed for nausea or vomiting. 20 tablet 0   pantoprazole (PROTONIX) 20 MG tablet Take 1 tablet (20 mg total) by mouth daily as needed (Acid Reflux). 90 tablet 1   propranolol (INDERAL) 20 MG tablet Take 1 tablet (20 mg total) by mouth 2 (two) times daily. 180 tablet 1   QUEtiapine (SEROQUEL) 100 MG  tablet Take 0.5 tablets (50 mg total) by mouth at bedtime. 45 tablet 1   tiZANidine (ZANAFLEX) 2 MG tablet Take 1 tablet (2 mg total) by mouth at bedtime. (Patient taking differently: Take 2 mg by mouth at bedtime as needed.) 30 tablet 0   venlafaxine XR (EFFEXOR XR) 75 MG 24 hr capsule Take 1 capsule (75 mg total) by mouth daily with breakfast. 90 capsule 1   No current facility-administered medications for this visit.      Objective:     Vitals:   08/16/21 1449  BP: 130/82  Pulse: 88  SpO2: 96%  Weight: 209 lb (94.8 kg)  Height: 5\' 10"  (1.778 m)      Body mass index is 29.99 kg/m.    Physical Exam:     General: Well-appearing, cooperative, sitting comfortably in no acute distress.   OMT Physical Exam:  ASIS Compression Test: Positive Right Cervical: TTP paraspinal, C3-5 RRSR Rib: Right elevated first rib with TTP Thoracic: TTP paraspinal, T6/8 RRSL Lumbar: TTP paraspinal, L1-3 RRSL Pelvis: Right anterior innominate  Electronically signed by:  09-06-1993 D.[facility] 3:25 PM 08/16/21

## 2021-08-16 ENCOUNTER — Ambulatory Visit (INDEPENDENT_AMBULATORY_CARE_PROVIDER_SITE_OTHER): Payer: 59 | Admitting: Sports Medicine

## 2021-08-16 VITALS — BP 130/82 | HR 88 | Ht 70.0 in | Wt 209.0 lb

## 2021-08-16 DIAGNOSIS — M9901 Segmental and somatic dysfunction of cervical region: Secondary | ICD-10-CM

## 2021-08-16 DIAGNOSIS — M9905 Segmental and somatic dysfunction of pelvic region: Secondary | ICD-10-CM | POA: Diagnosis not present

## 2021-08-16 DIAGNOSIS — M5442 Lumbago with sciatica, left side: Secondary | ICD-10-CM | POA: Diagnosis not present

## 2021-08-16 DIAGNOSIS — G8929 Other chronic pain: Secondary | ICD-10-CM | POA: Diagnosis not present

## 2021-08-16 DIAGNOSIS — M9903 Segmental and somatic dysfunction of lumbar region: Secondary | ICD-10-CM | POA: Diagnosis not present

## 2021-08-16 DIAGNOSIS — M9902 Segmental and somatic dysfunction of thoracic region: Secondary | ICD-10-CM

## 2021-08-16 DIAGNOSIS — M9908 Segmental and somatic dysfunction of rib cage: Secondary | ICD-10-CM

## 2021-08-16 NOTE — Patient Instructions (Signed)
Good to see you   Follow up in  

## 2021-09-11 NOTE — Progress Notes (Unsigned)
Stephen Sexton D.[facility] [address] [place] [address] Phone: [phone]   Assessment and Plan:     There are no diagnoses linked to this encounter.  *** - Patient has received significant relief with OMT in the past.  Elects for repeat OMT today.  Tolerated well per note below. - Decision today to treat with OMT was based on Physical Exam   After verbal consent patient was treated with HVLA (high velocity low amplitude), ME (muscle energy), FPR (flex positional release), ST (soft tissue), PC/PD (Pelvic Compression/ Pelvic Decompression) techniques in cervical, rib, thoracic, lumbar, and pelvic areas. Patient tolerated the procedure well with improvement in symptoms.  Patient educated on potential side effects of soreness and recommended to rest, hydrate, and use Tylenol as needed for pain control.   Pertinent previous records reviewed include ***   Follow Up: ***     Subjective:   Chief Complaint: OMT follow up    HPI:  01/22/21 Patient is a 21 year old male presenting with neck and back pain. Patient last saw [name] on 11/15/2020 for this reason and had OMT. Today patient states that he is here for maintenance OMT.     02/19/2021 Patient states after the last OMT visit the following week the neck got tight after laying on a friends bed gave him a crick in his neck for the next two days , back and neck still feel tight    03/25/2021 Patient states that he's feeling pretty good pain in his right shoulder blade.  States that he has felt good generally for about 4 weeks in between appointments, however going longer than 4 weeks he notices that pain started to return.  No new trauma or symptoms.   04/22/2021 Patient states that his hips have been hurting him the last couple of days, R shoulder has been giving him some problems 04/03/2021.    05/20/2021 Patient states that hes doing good has some pain in his shoulders and hips    06/20/2021 Patient  states that hes doing good   07/04/2021 Patient states that his back has been hurting and its radiating down to his hip and down his leg , no numbness tingling   5/11/023 Patient states he pulled a muscle in his back Tuesday had pain all down his leg hurts a little not as bad as it did on Tuesday    08/16/2021 Patient states that lower back and neck is tight   09/12/2021 Patient states   Relevant Historical Information: ***  Additional pertinent review of systems negative.  Current Outpatient Medications  Medication Sig Dispense Refill   diclofenac (VOLTAREN) 75 MG EC tablet Take 1 tablet (75 mg total) by mouth 2 (two) times daily as needed. 30 tablet 5   hydrOXYzine (VISTARIL) 25 MG capsule Take 1 capsule (25 mg total) by mouth 2 (two) times daily as needed. 180 capsule 1   levocetirizine (XYZAL) 5 MG tablet Take 1 tablet (5 mg total) by mouth every evening. 90 tablet 1   montelukast (SINGULAIR) 10 MG tablet Take 1 tablet (10 mg total) by mouth at bedtime. 90 tablet 1   ondansetron (ZOFRAN) 8 MG tablet Take 1 tablet (8 mg total) by mouth every 8 (eight) hours as needed for nausea or vomiting. 20 tablet 0   pantoprazole (PROTONIX) 20 MG tablet Take 1 tablet (20 mg total) by mouth daily as needed (Acid Reflux). 90 tablet 1   propranolol (INDERAL) 20 MG tablet  Take 1 tablet (20 mg total) by mouth 2 (two) times daily. 180 tablet 1   QUEtiapine (SEROQUEL) 100 MG tablet Take 0.5 tablets (50 mg total) by mouth at bedtime. 45 tablet 1   tiZANidine (ZANAFLEX) 2 MG tablet Take 1 tablet (2 mg total) by mouth at bedtime. (Patient taking differently: Take 2 mg by mouth at bedtime as needed.) 30 tablet 0   venlafaxine XR (EFFEXOR XR) 75 MG 24 hr capsule Take 1 capsule (75 mg total) by mouth daily with breakfast. 90 capsule 1   No current facility-administered medications for this visit.      Objective:     There were no vitals filed for this visit.    There is no height or weight on file to  calculate BMI.    Physical Exam:     General: Well-appearing, cooperative, sitting comfortably in no acute distress.   OMT Physical Exam:  ASIS Compression Test: Positive Right Cervical: TTP paraspinal, *** Rib: Bilateral elevated first rib with TTP Thoracic: TTP paraspinal,*** Lumbar: TTP paraspinal,*** Pelvis: Right anterior innominate  Electronically signed by:  Stephen Sexton [facility] 11:53 AM 09/11/21

## 2021-09-12 ENCOUNTER — Ambulatory Visit (INDEPENDENT_AMBULATORY_CARE_PROVIDER_SITE_OTHER): Payer: 59 | Admitting: Sports Medicine

## 2021-09-12 VITALS — BP 132/84 | HR 85 | Ht 70.0 in | Wt 216.0 lb

## 2021-09-12 DIAGNOSIS — M9901 Segmental and somatic dysfunction of cervical region: Secondary | ICD-10-CM | POA: Diagnosis not present

## 2021-09-12 DIAGNOSIS — M9903 Segmental and somatic dysfunction of lumbar region: Secondary | ICD-10-CM

## 2021-09-12 DIAGNOSIS — G8929 Other chronic pain: Secondary | ICD-10-CM

## 2021-09-12 DIAGNOSIS — M9902 Segmental and somatic dysfunction of thoracic region: Secondary | ICD-10-CM | POA: Diagnosis not present

## 2021-09-12 DIAGNOSIS — M5442 Lumbago with sciatica, left side: Secondary | ICD-10-CM | POA: Diagnosis not present

## 2021-09-12 DIAGNOSIS — M9908 Segmental and somatic dysfunction of rib cage: Secondary | ICD-10-CM

## 2021-09-12 DIAGNOSIS — M9905 Segmental and somatic dysfunction of pelvic region: Secondary | ICD-10-CM

## 2021-09-13 ENCOUNTER — Ambulatory Visit: Payer: 59 | Admitting: Sports Medicine

## 2021-10-14 NOTE — Progress Notes (Signed)
[name] D.[name] [address] [place] [address] Phone: [phone]   Assessment and Plan:     1. Chronic bilateral low back pain with left-sided sciatica 2. Somatic dysfunction of cervical region 3. Somatic dysfunction of thoracic region 4. Somatic dysfunction of lumbar region 5. Somatic dysfunction of pelvic region 6. Somatic dysfunction of rib region  -Chronic with exacerbation, subsequent visit - Recurrence of multiple musculoskeletal complaints with most prominent being left shoulder and left hip.  Patient requesting repeat OMT - Patient has received significant relief with OMT in the past.  Elects for repeat OMT today.  Tolerated well per note below. - Decision today to treat with OMT was based on Physical Exam   After verbal consent patient was treated with HVLA (high velocity low amplitude), ME (muscle energy), FPR (flex positional release), ST (soft tissue), PC/PD (Pelvic Compression/ Pelvic Decompression) techniques in cervical, rib, thoracic, lumbar, and pelvic areas. Patient tolerated the procedure well with improvement in symptoms.  Patient educated on potential side effects of soreness and recommended to rest, hydrate, and use Tylenol as needed for pain control.   Pertinent previous records reviewed include none   Follow Up: 4 weeks for reevaluation and repeat OMT if needed   Subjective:   Chief Complaint: OMT follow up    HPI:  01/22/21 Patient is a 21 year old male presenting with neck and back pain. Patient last saw [name] on 11/15/2020 for this reason and had OMT. Today patient states that he is here for maintenance OMT.     02/19/2021 Patient states after the last OMT visit the following week the neck got tight after laying on a friends bed gave him a crick in his neck for the next two days , back and neck still feel tight    03/25/2021 Patient states that he's feeling pretty good pain in his right shoulder blade.   States that he has felt good generally for about 4 weeks in between appointments, however going longer than 4 weeks he notices that pain started to return.  No new trauma or symptoms.   04/22/2021 Patient states that his hips have been hurting him the last couple of days, R shoulder has been giving him some problems 04/03/2021.    05/20/2021 Patient states that hes doing good has some pain in his shoulders and hips    06/20/2021 Patient states that hes doing good   07/04/2021 Patient states that his back has been hurting and its radiating down to his hip and down his leg , no numbness tingling   5/11/023 Patient states he pulled a muscle in his back Tuesday had pain all down his leg hurts a little not as bad as it did on Tuesday    08/16/2021 Patient states that lower back and neck is tight    09/12/2021 Patient states that he is good just a tune up    10/21/2021 Patient states normal , thinks he did something to hip yesterday has pain from lateral hip to ankle    Relevant Historical Information: None pertinent   Additional pertinent review of systems negative.  Current Outpatient Medications  Medication Sig Dispense Refill   diclofenac (VOLTAREN) 75 MG EC tablet Take 1 tablet (75 mg total) by mouth 2 (two) times daily as needed. 30 tablet 5   hydrOXYzine (VISTARIL) 25 MG capsule Take 1 capsule (25 mg total) by mouth 2 (two) times daily as needed. 180 capsule 1   levocetirizine (  XYZAL) 5 MG tablet Take 1 tablet (5 mg total) by mouth every evening. 90 tablet 1   montelukast (SINGULAIR) 10 MG tablet Take 1 tablet (10 mg total) by mouth at bedtime. 90 tablet 1   ondansetron (ZOFRAN) 8 MG tablet Take 1 tablet (8 mg total) by mouth every 8 (eight) hours as needed for nausea or vomiting. 20 tablet 0   pantoprazole (PROTONIX) 20 MG tablet Take 1 tablet (20 mg total) by mouth daily as needed (Acid Reflux). 90 tablet 1   propranolol (INDERAL) 20 MG tablet Take 1 tablet (20 mg total) by mouth 2  (two) times daily. 180 tablet 1   QUEtiapine (SEROQUEL) 100 MG tablet Take 0.5 tablets (50 mg total) by mouth at bedtime. 45 tablet 1   tiZANidine (ZANAFLEX) 2 MG tablet Take 1 tablet (2 mg total) by mouth at bedtime. (Patient taking differently: Take 2 mg by mouth at bedtime as needed.) 30 tablet 0   venlafaxine XR (EFFEXOR XR) 75 MG 24 hr capsule Take 1 capsule (75 mg total) by mouth daily with breakfast. 90 capsule 1   No current facility-administered medications for this visit.      Objective:     Vitals:   10/21/21 1433  BP: 118/80  Pulse: 85  SpO2: 96%  Weight: 216 lb (98 kg)  Height: 5\' 10"  (1.778 m)      Body mass index is 30.99 kg/m.    Physical Exam:     General: Well-appearing, cooperative, sitting comfortably in no acute distress.   OMT Physical Exam:  ASIS Compression Test: Positive left Cervical: TTP paraspinal, C3 RRSL Rib: Bilateral elevated first rib with TTP Thoracic: TTP paraspinal, T5-8 RRSL Lumbar: TTP paraspinal, L1-3 RLSR, L5 RR Pelvis: Left anterior innominate  Electronically signed by:  D.[name] 3:41 PM 10/21/21

## 2021-10-15 ENCOUNTER — Ambulatory Visit: Payer: 59 | Admitting: Sports Medicine

## 2021-10-21 ENCOUNTER — Ambulatory Visit (INDEPENDENT_AMBULATORY_CARE_PROVIDER_SITE_OTHER): Payer: 59 | Admitting: Sports Medicine

## 2021-10-21 VITALS — BP 118/80 | HR 85 | Ht 70.0 in | Wt 216.0 lb

## 2021-10-21 DIAGNOSIS — M9903 Segmental and somatic dysfunction of lumbar region: Secondary | ICD-10-CM | POA: Diagnosis not present

## 2021-10-21 DIAGNOSIS — M9901 Segmental and somatic dysfunction of cervical region: Secondary | ICD-10-CM | POA: Diagnosis not present

## 2021-10-21 DIAGNOSIS — G8929 Other chronic pain: Secondary | ICD-10-CM

## 2021-10-21 DIAGNOSIS — M5442 Lumbago with sciatica, left side: Secondary | ICD-10-CM

## 2021-10-21 DIAGNOSIS — M9905 Segmental and somatic dysfunction of pelvic region: Secondary | ICD-10-CM

## 2021-10-21 DIAGNOSIS — M9902 Segmental and somatic dysfunction of thoracic region: Secondary | ICD-10-CM

## 2021-10-21 DIAGNOSIS — M9908 Segmental and somatic dysfunction of rib cage: Secondary | ICD-10-CM

## 2021-10-21 NOTE — Patient Instructions (Signed)
Good to see you   

## 2021-11-21 ENCOUNTER — Ambulatory Visit: Payer: 59 | Admitting: Sports Medicine

## 2021-11-21 NOTE — Progress Notes (Unsigned)
Stephen Sexton D.[name] [address] [place] [address] Phone: [phone]   Assessment and Plan:    1. Chronic bilateral low back pain with left-sided sciatica 2. Somatic dysfunction of cervical region 3. Somatic dysfunction of thoracic region 4. Somatic dysfunction of lumbar region 5. Somatic dysfunction of pelvic region 6. Somatic dysfunction of rib region -Chronic with exacerbation, subsequent visit - Recurrence of multiple musculoskeletal complaints with most prominent being left lower back and hip without red flag symptoms - Patient has received significant relief with OMT in the past.  Elects for repeat OMT today.  Tolerated well per note below. - Decision today to treat with OMT was based on Physical Exam   After verbal consent patient was treated with HVLA (high velocity low amplitude), ME (muscle energy), FPR (flex positional release), ST (soft tissue), PC/PD (Pelvic Compression/ Pelvic Decompression) techniques in cervical, rib, thoracic, lumbar, and pelvic areas. Patient tolerated the procedure well with improvement in symptoms.  Patient educated on potential side effects of soreness and recommended to rest, hydrate, and use Tylenol as needed for pain control.   Pertinent previous records reviewed include none   Follow Up: 4 weeks for reevaluation.  Could consider repeat OMT   Subjective:   I, Calais Svehla, am serving as a Neurosurgeon for [name]  Chief Complaint: OMT follow up    HPI:  01/22/21 Patient is a 21 year old male presenting with neck and back pain. Patient last saw [name] on 11/15/2020 for this reason and had OMT. Today patient states that he is here for maintenance OMT.     02/19/2021 Patient states after the last OMT visit the following week the neck got tight after laying on a friends bed gave him a crick in his neck for the next two days , back and neck still feel tight    03/25/2021 Patient states that  he's feeling pretty good pain in his right shoulder blade.  States that he has felt good generally for about 4 weeks in between appointments, however going longer than 4 weeks he notices that pain started to return.  No new trauma or symptoms.   04/22/2021 Patient states that his hips have been hurting him the last couple of days, R shoulder has been giving him some problems 04/03/2021.    05/20/2021 Patient states that hes doing good has some pain in his shoulders and hips    06/20/2021 Patient states that hes doing good   07/04/2021 Patient states that his back has been hurting and its radiating down to his hip and down his leg , no numbness tingling   5/11/023 Patient states he pulled a muscle in his back Tuesday had pain all down his leg hurts a little not as bad as it did on Tuesday    08/16/2021 Patient states that lower back and neck is tight    09/12/2021 Patient states that he is good just a tune up    10/21/2021 Patient states normal , thinks he did something to hip yesterday has pain from lateral hip to ankle   9/21/023 Patient states he is good left hip had been hurting a week ago but it hasn't hurt since   Relevant Historical Information: None pertinent Additional pertinent review of systems negative.  Current Outpatient Medications  Medication Sig Dispense Refill   diclofenac (VOLTAREN) 75 MG EC tablet Take 1 tablet (75 mg total) by mouth 2 (two) times daily as needed. 30 tablet  5   hydrOXYzine (VISTARIL) 25 MG capsule Take 1 capsule (25 mg total) by mouth 2 (two) times daily as needed. 180 capsule 1   levocetirizine (XYZAL) 5 MG tablet Take 1 tablet (5 mg total) by mouth every evening. 90 tablet 1   montelukast (SINGULAIR) 10 MG tablet Take 1 tablet (10 mg total) by mouth at bedtime. 90 tablet 1   ondansetron (ZOFRAN) 8 MG tablet Take 1 tablet (8 mg total) by mouth every 8 (eight) hours as needed for nausea or vomiting. 20 tablet 0   pantoprazole (PROTONIX) 20 MG tablet  Take 1 tablet (20 mg total) by mouth daily as needed (Acid Reflux). 90 tablet 1   propranolol (INDERAL) 20 MG tablet Take 1 tablet (20 mg total) by mouth 2 (two) times daily. 180 tablet 1   QUEtiapine (SEROQUEL) 100 MG tablet Take 0.5 tablets (50 mg total) by mouth at bedtime. 45 tablet 1   tiZANidine (ZANAFLEX) 2 MG tablet Take 1 tablet (2 mg total) by mouth at bedtime. (Patient taking differently: Take 2 mg by mouth at bedtime as needed.) 30 tablet 0   venlafaxine XR (EFFEXOR XR) 75 MG 24 hr capsule Take 1 capsule (75 mg total) by mouth daily with breakfast. 90 capsule 1   No current facility-administered medications for this visit.      Objective:     Vitals:   11/28/21 1538  BP: 110/81  Pulse: 95  SpO2: 99%  Weight: 216 lb (98 kg)  Height: 5\' 10"  (1.778 m)      Body mass index is 30.99 kg/m.    Physical Exam:     General: Well-appearing, cooperative, sitting comfortably in no acute distress.   OMT Physical Exam:  ASIS Compression Test: Positive left Cervical: TTP paraspinal, C3 RR SL Rib: Bilateral elevated first rib with nTTP Thoracic: TTP paraspinal, T4-9 RRSL Lumbar: TTP paraspinal, L1-3 RLSR, L5 RL Pelvis: Left posterior innominate  Electronically signed by:  D.[facility] 4:15 PM 11/28/21

## 2021-11-28 ENCOUNTER — Ambulatory Visit (INDEPENDENT_AMBULATORY_CARE_PROVIDER_SITE_OTHER): Payer: 59 | Admitting: Sports Medicine

## 2021-11-28 VITALS — BP 110/81 | HR 95 | Ht 70.0 in | Wt 216.0 lb

## 2021-11-28 DIAGNOSIS — M9901 Segmental and somatic dysfunction of cervical region: Secondary | ICD-10-CM

## 2021-11-28 DIAGNOSIS — G8929 Other chronic pain: Secondary | ICD-10-CM | POA: Diagnosis not present

## 2021-11-28 DIAGNOSIS — M9908 Segmental and somatic dysfunction of rib cage: Secondary | ICD-10-CM

## 2021-11-28 DIAGNOSIS — M9905 Segmental and somatic dysfunction of pelvic region: Secondary | ICD-10-CM

## 2021-11-28 DIAGNOSIS — M5442 Lumbago with sciatica, left side: Secondary | ICD-10-CM

## 2021-11-28 DIAGNOSIS — M9902 Segmental and somatic dysfunction of thoracic region: Secondary | ICD-10-CM | POA: Diagnosis not present

## 2021-11-28 DIAGNOSIS — M9903 Segmental and somatic dysfunction of lumbar region: Secondary | ICD-10-CM | POA: Diagnosis not present

## 2021-11-28 NOTE — Patient Instructions (Signed)
Good to see you   

## 2021-12-23 ENCOUNTER — Ambulatory Visit (INDEPENDENT_AMBULATORY_CARE_PROVIDER_SITE_OTHER): Payer: 59 | Admitting: Family Medicine

## 2021-12-23 VITALS — BP 127/74 | HR 97 | Temp 98.6°F | Ht 70.0 in | Wt 214.0 lb

## 2021-12-23 DIAGNOSIS — R051 Acute cough: Secondary | ICD-10-CM

## 2021-12-23 DIAGNOSIS — J029 Acute pharyngitis, unspecified: Secondary | ICD-10-CM | POA: Diagnosis not present

## 2021-12-23 DIAGNOSIS — J111 Influenza due to unidentified influenza virus with other respiratory manifestations: Secondary | ICD-10-CM | POA: Diagnosis not present

## 2021-12-23 DIAGNOSIS — R509 Fever, unspecified: Secondary | ICD-10-CM | POA: Diagnosis not present

## 2021-12-23 LAB — POC COVID19 BINAXNOW: SARS Coronavirus 2 Ag: NEGATIVE

## 2021-12-23 LAB — POCT INFLUENZA A/B
Influenza A, POC: NEGATIVE
Influenza B, POC: NEGATIVE

## 2021-12-23 LAB — POCT RAPID STREP A (OFFICE): Rapid Strep A Screen: NEGATIVE

## 2021-12-23 NOTE — Progress Notes (Signed)
OFFICE VISIT  12/23/2021  CC:  Chief Complaint  Patient presents with   Cough   Sore Throat   Nasal congestion    Patient is a 21 y.o. male who presents for cough.  HPI: 3d nasal cong, PND, ST, cough.  Tm last couple days 100.5.  Achy and fatigued. Slight chest tight/wheeze.  No CP or SOB.  No HA or face pain. No n/v/d.  No rash. Mucinex and ibup taken.   Past Medical History:  Diagnosis Date   Abdominal migraine 09/08/2017   Acid reflux    ADHD    Anxiety    Cyclic vomiting syndrome    Depression    Dysphonia 04/14/2017   Environmental and seasonal allergies    Tension headache 12/26/2016    Past Surgical History:  Procedure Laterality Date   NO PAST SURGERIES      Outpatient Medications Prior to Visit  Medication Sig Dispense Refill   levocetirizine (XYZAL) 5 MG tablet Take 1 tablet (5 mg total) by mouth every evening. 90 tablet 1   montelukast (SINGULAIR) 10 MG tablet Take 1 tablet (10 mg total) by mouth at bedtime. 90 tablet 1   pantoprazole (PROTONIX) 20 MG tablet Take 1 tablet (20 mg total) by mouth daily as needed (Acid Reflux). 90 tablet 1   propranolol (INDERAL) 20 MG tablet Take 1 tablet (20 mg total) by mouth 2 (two) times daily. 180 tablet 1   QUEtiapine (SEROQUEL) 100 MG tablet Take 0.5 tablets (50 mg total) by mouth at bedtime. 45 tablet 1   tiZANidine (ZANAFLEX) 2 MG tablet Take 1 tablet (2 mg total) by mouth at bedtime. (Patient taking differently: Take 2 mg by mouth at bedtime as needed.) 30 tablet 0   venlafaxine XR (EFFEXOR XR) 75 MG 24 hr capsule Take 1 capsule (75 mg total) by mouth daily with breakfast. 90 capsule 1   diclofenac (VOLTAREN) 75 MG EC tablet Take 1 tablet (75 mg total) by mouth 2 (two) times daily as needed. (Patient not taking: Reported on 12/23/2021) 30 tablet 5   hydrOXYzine (VISTARIL) 25 MG capsule Take 1 capsule (25 mg total) by mouth 2 (two) times daily as needed. (Patient not taking: Reported on 12/23/2021) 180 capsule 1    ondansetron (ZOFRAN) 8 MG tablet Take 1 tablet (8 mg total) by mouth every 8 (eight) hours as needed for nausea or vomiting. (Patient not taking: Reported on 12/23/2021) 20 tablet 0   No facility-administered medications prior to visit.    Allergies  Allergen Reactions   Amitriptyline     Anger    ROS As per HPI  PE:    12/23/2021   10:53 AM 11/28/2021    3:38 PM 10/21/2021    2:33 PM  Vitals with BMI  Height 5\' 10"  5\' 10"  5\' 10"   Weight 214 lbs 216 lbs 216 lbs  BMI 30.71 Q000111Q Q000111Q  Systolic [id number]  Diastolic 74 81 80  Pulse 97 95 85  02 sat 99% RA today T98.6  Physical Exam  VS: noted--normal. Gen: alert, NAD, NONTOXIC APPEARING. HEENT: eyes without injection, drainage, or swelling.  Ears: EACs clear, TMs with normal light reflex and landmarks.  Nose: Clear rhinorrhea, with some dried, crusty exudate adherent to mildly injected mucosa.  No purulent d/c.  No paranasal sinus TTP.  No facial swelling.  Throat and mouth without focal lesion.  No pharyngial swelling, erythema, or exudate.   Neck: supple, no LAD.   LUNGS: CTA bilat, nonlabored resps.  CV: RRR, no m/r/g. EXT: no c/c/e SKIN: no rash   LABS:  Last CBC Lab Results  Component Value Date   WBC 10.2 09/23/2020   HGB 15.9 09/23/2020   HCT 46.3 09/23/2020   MCV 89.6 09/23/2020   MCH 30.8 09/23/2020   RDW 11.3 (L) 09/23/2020   PLT 286 81/82/9937   Last metabolic panel Lab Results  Component Value Date   GLUCOSE 106 (H) 09/23/2020   NA 138 09/23/2020   K 3.4 (L) 09/23/2020   CL 103 09/23/2020   CO2 26 09/23/2020   BUN 11 09/23/2020   CREATININE 1.03 09/23/2020   GFRNONAA >60 09/23/2020   CALCIUM 9.6 09/23/2020   PROT 8.5 (H) 09/23/2020   ALBUMIN 4.8 09/23/2020   BILITOT 0.3 09/23/2020   ALKPHOS 102 09/23/2020   AST 26 09/23/2020   ALT 45 (H) 09/23/2020   ANIONGAP 9 09/23/2020   IMPRESSION AND PLAN:  Influenza-like illness.   Symptomatic care reviewed. Flu and covid here today:  NEG. Rapid strep NEG. Signs/symptoms to call or return for were reviewed and pt expressed understanding.  An After Visit Summary was printed and given to the patient.  FOLLOW UP: Return if symptoms worsen or fail to improve.  Signed:  Crissie Sickles, MD           12/23/2021

## 2021-12-26 ENCOUNTER — Ambulatory Visit (INDEPENDENT_AMBULATORY_CARE_PROVIDER_SITE_OTHER): Payer: 59 | Admitting: Sports Medicine

## 2021-12-26 VITALS — HR 79 | Ht 70.0 in | Wt 213.0 lb

## 2021-12-26 DIAGNOSIS — G8929 Other chronic pain: Secondary | ICD-10-CM | POA: Diagnosis not present

## 2021-12-26 DIAGNOSIS — M5442 Lumbago with sciatica, left side: Secondary | ICD-10-CM

## 2021-12-26 DIAGNOSIS — M9901 Segmental and somatic dysfunction of cervical region: Secondary | ICD-10-CM

## 2021-12-26 DIAGNOSIS — M9902 Segmental and somatic dysfunction of thoracic region: Secondary | ICD-10-CM

## 2021-12-26 DIAGNOSIS — M9903 Segmental and somatic dysfunction of lumbar region: Secondary | ICD-10-CM

## 2021-12-26 DIAGNOSIS — M9905 Segmental and somatic dysfunction of pelvic region: Secondary | ICD-10-CM

## 2021-12-26 DIAGNOSIS — M9908 Segmental and somatic dysfunction of rib cage: Secondary | ICD-10-CM

## 2021-12-26 NOTE — Progress Notes (Signed)
Benito Mccreedy D.[facility] [place] Phone: [phone]   Assessment and Plan:     1. Chronic bilateral low back pain with left-sided sciatica 2. Somatic dysfunction of cervical region 3. Somatic dysfunction of thoracic region 4. Somatic dysfunction of lumbar region 5. Somatic dysfunction of pelvic region 6. somatic dysfunction of rib region  -Chronic, stable, subsequent visit - Overall multiple musculoskeletal complaints with most prominent being low back are overall well controlled with HEP and regular OMT visits - Patient has received significant relief with OMT in the past.  Elects for repeat OMT today.  Tolerated well per note below. - Decision today to treat with OMT was based on Physical Exam   After verbal consent patient was treated with HVLA (high velocity low amplitude), ME (muscle energy), FPR (flex positional release), ST (soft tissue), PC/PD (Pelvic Compression/ Pelvic Decompression) techniques in cervical, rib, thoracic, lumbar, and pelvic areas. Patient tolerated the procedure well with improvement in symptoms.  Patient educated on potential side effects of soreness and recommended to rest, hydrate, and use Tylenol as needed for pain control.   Pertinent previous records reviewed include none   Follow Up: 4 weeks for reevaluation.  Could consider repeat OMT   Subjective:   I, [name], am serving as a Education administrator for Doctor Glennon Mac  Chief Complaint: OMT follow up    HPI:  01/22/21 Patient is a 21 year old male presenting with neck and back pain. Patient last saw Dr. Tamala Julian on 11/15/2020 for this reason and had OMT. Today patient states that he is here for maintenance OMT.     02/19/2021 Patient states after the last OMT visit the following week the neck got tight after laying on a friends bed gave him a crick in his neck for the next two days , back and neck still feel tight    03/25/2021 Patient states  that he's feeling pretty good pain in his right shoulder blade.  States that he has felt good generally for about 4 weeks in between appointments, however going longer than 4 weeks he notices that pain started to return.  No new trauma or symptoms.   04/22/2021 Patient states that his hips have been hurting him the last couple of days, R shoulder has been giving him some problems 04/03/2021.    05/20/2021 Patient states that hes doing good has some pain in his shoulders and hips    06/20/2021 Patient states that hes doing good   07/04/2021 Patient states that his back has been hurting and its radiating down to his hip and down his leg , no numbness tingling   5/11/023 Patient states he pulled a muscle in his back Tuesday had pain all down his leg hurts a little not as bad as it did on Tuesday    08/16/2021 Patient states that lower back and neck is tight    09/12/2021 Patient states that he is good just a tune up    10/21/2021 Patient states normal , thinks he did something to hip yesterday has pain from lateral hip to ankle   9/21/023 Patient states he is good left hip had been hurting a week ago but it hasn't hurt since   12/26/2021 Patient states that he is pretty good just needs a tune up     Relevant Historical Information: None pertinent  Additional pertinent review of systems negative.  Current Outpatient Medications  Medication Sig Dispense Refill   diclofenac (  VOLTAREN) 75 MG EC tablet Take 1 tablet (75 mg total) by mouth 2 (two) times daily as needed. 30 tablet 5   hydrOXYzine (VISTARIL) 25 MG capsule Take 1 capsule (25 mg total) by mouth 2 (two) times daily as needed. 180 capsule 1   levocetirizine (XYZAL) 5 MG tablet Take 1 tablet (5 mg total) by mouth every evening. 90 tablet 1   montelukast (SINGULAIR) 10 MG tablet Take 1 tablet (10 mg total) by mouth at bedtime. 90 tablet 1   ondansetron (ZOFRAN) 8 MG tablet Take 1 tablet (8 mg total) by mouth every 8 (eight) hours as  needed for nausea or vomiting. 20 tablet 0   pantoprazole (PROTONIX) 20 MG tablet Take 1 tablet (20 mg total) by mouth daily as needed (Acid Reflux). 90 tablet 1   propranolol (INDERAL) 20 MG tablet Take 1 tablet (20 mg total) by mouth 2 (two) times daily. 180 tablet 1   QUEtiapine (SEROQUEL) 100 MG tablet Take 0.5 tablets (50 mg total) by mouth at bedtime. 45 tablet 1   tiZANidine (ZANAFLEX) 2 MG tablet Take 1 tablet (2 mg total) by mouth at bedtime. (Patient taking differently: Take 2 mg by mouth at bedtime as needed.) 30 tablet 0   venlafaxine XR (EFFEXOR XR) 75 MG 24 hr capsule Take 1 capsule (75 mg total) by mouth daily with breakfast. 90 capsule 1   No current facility-administered medications for this visit.      Objective:     Vitals:   12/26/21 1535  Pulse: 79  SpO2: 96%  Weight: 213 lb (96.6 kg)  Height: 5\' 10"  (1.778 m)      Body mass index is 30.56 kg/m.    Physical Exam:     General: Well-appearing, cooperative, sitting comfortably in no acute distress.   OMT Physical Exam:  ASIS Compression Test: Positive Right Cervical: TTP paraspinal, C3 RL SR Rib: Bilateral elevated first rib with nTTP Thoracic: TTP paraspinal, T6 RRSR Lumbar: TTP paraspinal, L1-3 RRSL, L5 RL Pelvis: Right anterior innominate  Electronically signed by:  D.[name] 3:46 PM 12/26/21

## 2021-12-26 NOTE — Patient Instructions (Signed)
Good to see you   

## 2022-01-22 NOTE — Progress Notes (Unsigned)
[name] D.[name] [address] [place] [address] Phone: [phone]   Assessment and Plan:     There are no diagnoses linked to this encounter.  *** - Patient has received significant relief with OMT in the past.  Elects for repeat OMT today.  Tolerated well per note below. - Decision today to treat with OMT was based on Physical Exam   After verbal consent patient was treated with HVLA (high velocity low amplitude), ME (muscle energy), FPR (flex positional release), ST (soft tissue), PC/PD (Pelvic Compression/ Pelvic Decompression) techniques in cervical, rib, thoracic, lumbar, and pelvic areas. Patient tolerated the procedure well with improvement in symptoms.  Patient educated on potential side effects of soreness and recommended to rest, hydrate, and use Tylenol as needed for pain control.   Pertinent previous records reviewed include ***   Follow Up: ***     Subjective:   I, Jahmeer Porche, am serving as a Neurosurgeon for [name]  Chief Complaint: OMT follow up    HPI:  01/22/21 Patient is a 21 year old male presenting with neck and back pain. Patient last saw [name] on 11/15/2020 for this reason and had OMT. Today patient states that he is here for maintenance OMT.     02/19/2021 Patient states after the last OMT visit the following week the neck got tight after laying on a friends bed gave him a crick in his neck for the next two days , back and neck still feel tight    03/25/2021 Patient states that he's feeling pretty good pain in his right shoulder blade.  States that he has felt good generally for about 4 weeks in between appointments, however going longer than 4 weeks he notices that pain started to return.  No new trauma or symptoms.   04/22/2021 Patient states that his hips have been hurting him the last couple of days, R shoulder has been giving him some problems 04/03/2021.    05/20/2021 Patient states that hes  doing good has some pain in his shoulders and hips    06/20/2021 Patient states that hes doing good   07/04/2021 Patient states that his back has been hurting and its radiating down to his hip and down his leg , no numbness tingling   5/11/023 Patient states he pulled a muscle in his back Tuesday had pain all down his leg hurts a little not as bad as it did on Tuesday    08/16/2021 Patient states that lower back and neck is tight    09/12/2021 Patient states that he is good just a tune up    10/21/2021 Patient states normal , thinks he did something to hip yesterday has pain from lateral hip to ankle   9/21/023 Patient states he is good left hip had been hurting a week ago but it hasn't hurt since    12/26/2021 Patient states that he is pretty good just needs a tune up   01/23/2022 Patient states     Relevant Historical Information: None pertinent  Additional pertinent review of systems negative.  Current Outpatient Medications  Medication Sig Dispense Refill   diclofenac (VOLTAREN) 75 MG EC tablet Take 1 tablet (75 mg total) by mouth 2 (two) times daily as needed. 30 tablet 5   hydrOXYzine (VISTARIL) 25 MG capsule Take 1 capsule (25 mg total) by mouth 2 (two) times daily as needed. 180 capsule 1   levocetirizine (XYZAL) 5 MG tablet Take 1  tablet (5 mg total) by mouth every evening. 90 tablet 1   montelukast (SINGULAIR) 10 MG tablet Take 1 tablet (10 mg total) by mouth at bedtime. 90 tablet 1   ondansetron (ZOFRAN) 8 MG tablet Take 1 tablet (8 mg total) by mouth every 8 (eight) hours as needed for nausea or vomiting. 20 tablet 0   pantoprazole (PROTONIX) 20 MG tablet Take 1 tablet (20 mg total) by mouth daily as needed (Acid Reflux). 90 tablet 1   propranolol (INDERAL) 20 MG tablet Take 1 tablet (20 mg total) by mouth 2 (two) times daily. 180 tablet 1   QUEtiapine (SEROQUEL) 100 MG tablet Take 0.5 tablets (50 mg total) by mouth at bedtime. 45 tablet 1   tiZANidine (ZANAFLEX) 2 MG  tablet Take 1 tablet (2 mg total) by mouth at bedtime. (Patient taking differently: Take 2 mg by mouth at bedtime as needed.) 30 tablet 0   venlafaxine XR (EFFEXOR XR) 75 MG 24 hr capsule Take 1 capsule (75 mg total) by mouth daily with breakfast. 90 capsule 1   No current facility-administered medications for this visit.      Objective:     There were no vitals filed for this visit.    There is no height or weight on file to calculate BMI.    Physical Exam:     General: Well-appearing, cooperative, sitting comfortably in no acute distress.   OMT Physical Exam:  ASIS Compression Test: Positive Right Cervical: TTP paraspinal, *** Rib: Bilateral elevated first rib with TTP Thoracic: TTP paraspinal,*** Lumbar: TTP paraspinal,*** Pelvis: Right anterior innominate  Electronically signed by:  [name] D.[name] 3:33 PM 01/22/22

## 2022-01-23 ENCOUNTER — Ambulatory Visit (INDEPENDENT_AMBULATORY_CARE_PROVIDER_SITE_OTHER): Payer: 59 | Admitting: Sports Medicine

## 2022-01-23 VITALS — HR 97 | Ht 70.0 in | Wt 213.0 lb

## 2022-01-23 DIAGNOSIS — M9901 Segmental and somatic dysfunction of cervical region: Secondary | ICD-10-CM | POA: Diagnosis not present

## 2022-01-23 DIAGNOSIS — M9903 Segmental and somatic dysfunction of lumbar region: Secondary | ICD-10-CM | POA: Diagnosis not present

## 2022-01-23 DIAGNOSIS — G8929 Other chronic pain: Secondary | ICD-10-CM

## 2022-01-23 DIAGNOSIS — M9905 Segmental and somatic dysfunction of pelvic region: Secondary | ICD-10-CM

## 2022-01-23 DIAGNOSIS — M5442 Lumbago with sciatica, left side: Secondary | ICD-10-CM

## 2022-01-23 DIAGNOSIS — M9908 Segmental and somatic dysfunction of rib cage: Secondary | ICD-10-CM

## 2022-01-23 DIAGNOSIS — M9902 Segmental and somatic dysfunction of thoracic region: Secondary | ICD-10-CM

## 2022-01-23 NOTE — Patient Instructions (Signed)
Good to see you   

## 2022-02-19 NOTE — Progress Notes (Unsigned)
Stephen Sexton D.[name] [address] [place] [address] Phone: [phone]   Assessment and Plan:     There are no diagnoses linked to this encounter.  *** - Patient has received significant relief with OMT in the past.  Elects for repeat OMT today.  Tolerated well per note below. - Decision today to treat with OMT was based on Physical Exam   After verbal consent patient was treated with HVLA (high velocity low amplitude), ME (muscle energy), FPR (flex positional release), ST (soft tissue), PC/PD (Pelvic Compression/ Pelvic Decompression) techniques in cervical, rib, thoracic, lumbar, and pelvic areas. Patient tolerated the procedure well with improvement in symptoms.  Patient educated on potential side effects of soreness and recommended to rest, hydrate, and use Tylenol as needed for pain control.   Pertinent previous records reviewed include ***   Follow Up: ***     Subjective:   I, Stephen Sexton, am serving as a Neurosurgeon for Doctor Stephen Sexton  Chief Complaint: OMT follow up    HPI:  01/22/21 Patient is a 21 year old male presenting with neck and back pain. Patient last saw [name] on 11/15/2020 for this reason and had OMT. Today patient states that he is here for maintenance OMT.     02/19/2021 Patient states after the last OMT visit the following week the neck got tight after laying on a friends bed gave him a crick in his neck for the next two days , back and neck still feel tight    03/25/2021 Patient states that he's feeling pretty good pain in his right shoulder blade.  States that he has felt good generally for about 4 weeks in between appointments, however going longer than 4 weeks he notices that pain started to return.  No new trauma or symptoms.   04/22/2021 Patient states that his hips have been hurting him the last couple of days, R shoulder has been giving him some problems 04/03/2021.    05/20/2021 Patient states that hes  doing good has some pain in his shoulders and hips    06/20/2021 Patient states that hes doing good   07/04/2021 Patient states that his back has been hurting and its radiating down to his hip and down his leg , no numbness tingling   5/11/023 Patient states he pulled a muscle in his back Tuesday had pain all down his leg hurts a little not as bad as it did on Tuesday    08/16/2021 Patient states that lower back and neck is tight    09/12/2021 Patient states that he is good just a tune up    10/21/2021 Patient states normal , thinks he did something to hip yesterday has pain from lateral hip to ankle   9/21/023 Patient states he is good left hip had been hurting a week ago but it hasn't hurt since    12/26/2021 Patient states that he is pretty good just needs a tune up    01/23/2022 Patient states back pain this week   02/20/2022 Patient states     Relevant Historical Information: None pertinent    Additional pertinent review of systems negative.  Current Outpatient Medications  Medication Sig Dispense Refill   diclofenac (VOLTAREN) 75 MG EC tablet Take 1 tablet (75 mg total) by mouth 2 (two) times daily as needed. 30 tablet 5   hydrOXYzine (VISTARIL) 25 MG capsule Take 1 capsule (25 mg total) by mouth 2 (two) times daily as needed.  180 capsule 1   levocetirizine (XYZAL) 5 MG tablet Take 1 tablet (5 mg total) by mouth every evening. 90 tablet 1   montelukast (SINGULAIR) 10 MG tablet Take 1 tablet (10 mg total) by mouth at bedtime. 90 tablet 1   ondansetron (ZOFRAN) 8 MG tablet Take 1 tablet (8 mg total) by mouth every 8 (eight) hours as needed for nausea or vomiting. 20 tablet 0   pantoprazole (PROTONIX) 20 MG tablet Take 1 tablet (20 mg total) by mouth daily as needed (Acid Reflux). 90 tablet 1   propranolol (INDERAL) 20 MG tablet Take 1 tablet (20 mg total) by mouth 2 (two) times daily. 180 tablet 1   QUEtiapine (SEROQUEL) 100 MG tablet Take 0.5 tablets (50 mg total) by  mouth at bedtime. 45 tablet 1   tiZANidine (ZANAFLEX) 2 MG tablet Take 1 tablet (2 mg total) by mouth at bedtime. (Patient taking differently: Take 2 mg by mouth at bedtime as needed.) 30 tablet 0   venlafaxine XR (EFFEXOR XR) 75 MG 24 hr capsule Take 1 capsule (75 mg total) by mouth daily with breakfast. 90 capsule 1   No current facility-administered medications for this visit.      Objective:     There were no vitals filed for this visit.    There is no height or weight on file to calculate BMI.    Physical Exam:     General: Well-appearing, cooperative, sitting comfortably in no acute distress.   OMT Physical Exam:  ASIS Compression Test: Positive Right Cervical: TTP paraspinal, *** Rib: Bilateral elevated first rib with TTP Thoracic: TTP paraspinal,*** Lumbar: TTP paraspinal,*** Pelvis: Right anterior innominate  Electronically signed by:  Stephen Sexton D.[name] 11:39 AM 02/19/22

## 2022-02-20 ENCOUNTER — Ambulatory Visit (INDEPENDENT_AMBULATORY_CARE_PROVIDER_SITE_OTHER): Payer: 59 | Admitting: Sports Medicine

## 2022-02-20 VITALS — HR 89 | Ht 70.0 in | Wt 214.0 lb

## 2022-02-20 DIAGNOSIS — M9905 Segmental and somatic dysfunction of pelvic region: Secondary | ICD-10-CM

## 2022-02-20 DIAGNOSIS — M9902 Segmental and somatic dysfunction of thoracic region: Secondary | ICD-10-CM

## 2022-02-20 DIAGNOSIS — M5442 Lumbago with sciatica, left side: Secondary | ICD-10-CM

## 2022-02-20 DIAGNOSIS — G8929 Other chronic pain: Secondary | ICD-10-CM | POA: Diagnosis not present

## 2022-02-20 DIAGNOSIS — M9901 Segmental and somatic dysfunction of cervical region: Secondary | ICD-10-CM | POA: Diagnosis not present

## 2022-02-20 DIAGNOSIS — M9903 Segmental and somatic dysfunction of lumbar region: Secondary | ICD-10-CM

## 2022-02-20 DIAGNOSIS — M25511 Pain in right shoulder: Secondary | ICD-10-CM

## 2022-02-20 DIAGNOSIS — M9907 Segmental and somatic dysfunction of upper extremity: Secondary | ICD-10-CM

## 2022-02-20 NOTE — Patient Instructions (Signed)
Good to see you   

## 2022-03-19 NOTE — Progress Notes (Unsigned)
Stephen Sexton D.[place] Phone: [phone]   Assessment and Plan:     There are no diagnoses linked to this encounter.  ***   Pertinent previous records reviewed include ***   Follow Up: ***     Subjective:   I, Selwyn Reason, am serving as a Education administrator for Doctor Glennon Mac  Chief Complaint: OMT follow up    HPI:  01/22/21 Patient is a 22 year old male presenting with neck and back pain. Patient last saw Dr. Tamala Julian on 11/15/2020 for this reason and had OMT. Today patient states that he is here for maintenance OMT.     02/19/2021 Patient states after the last OMT visit the following week the neck got tight after laying on a friends bed gave him a crick in his neck for the next two days , back and neck still feel tight    03/25/2021 Patient states that he's feeling pretty good pain in his right shoulder blade.  States that he has felt good generally for about 4 weeks in between appointments, however going longer than 4 weeks he notices that pain started to return.  No new trauma or symptoms.   04/22/2021 Patient states that his hips have been hurting him the last couple of days, R shoulder has been giving him some problems 04/03/2021.    05/20/2021 Patient states that hes doing good has some pain in his shoulders and hips    06/20/2021 Patient states that hes doing good   07/04/2021 Patient states that his back has been hurting and its radiating down to his hip and down his leg , no numbness tingling   5/11/023 Patient states he pulled a muscle in his back Tuesday had pain all down his leg hurts a little not as bad as it did on Tuesday    08/16/2021 Patient states that lower back and neck is tight    09/12/2021 Patient states that he is good just a tune up    10/21/2021 Patient states normal , thinks he did something to hip yesterday has pain from lateral hip to ankle   9/21/023 Patient  states he is good left hip had been hurting a week ago but it hasn't hurt since    12/26/2021 Patient states that he is pretty good just needs a tune up    01/23/2022 Patient states back pain this week    02/20/2022 Patient states had some pain in his right hip and lower back   03/20/2022 Patient states     Relevant Historical Information: None pertinent  Additional pertinent review of systems negative.   Current Outpatient Medications:    diclofenac (VOLTAREN) 75 MG EC tablet, Take 1 tablet (75 mg total) by mouth 2 (two) times daily as needed., Disp: 30 tablet, Rfl: 5   hydrOXYzine (VISTARIL) 25 MG capsule, Take 1 capsule (25 mg total) by mouth 2 (two) times daily as needed., Disp: 180 capsule, Rfl: 1   levocetirizine (XYZAL) 5 MG tablet, Take 1 tablet (5 mg total) by mouth every evening., Disp: 90 tablet, Rfl: 1   montelukast (SINGULAIR) 10 MG tablet, Take 1 tablet (10 mg total) by mouth at bedtime., Disp: 90 tablet, Rfl: 1   ondansetron (ZOFRAN) 8 MG tablet, Take 1 tablet (8 mg total) by mouth every 8 (eight) hours as needed for nausea or vomiting., Disp: 20 tablet, Rfl: 0   pantoprazole (PROTONIX) 20 MG tablet, Take 1 tablet (20  mg total) by mouth daily as needed (Acid Reflux)., Disp: 90 tablet, Rfl: 1   propranolol (INDERAL) 20 MG tablet, Take 1 tablet (20 mg total) by mouth 2 (two) times daily., Disp: 180 tablet, Rfl: 1   QUEtiapine (SEROQUEL) 100 MG tablet, Take 0.5 tablets (50 mg total) by mouth at bedtime., Disp: 45 tablet, Rfl: 1   tiZANidine (ZANAFLEX) 2 MG tablet, Take 1 tablet (2 mg total) by mouth at bedtime. (Patient taking differently: Take 2 mg by mouth at bedtime as needed.), Disp: 30 tablet, Rfl: 0   venlafaxine XR (EFFEXOR XR) 75 MG 24 hr capsule, Take 1 capsule (75 mg total) by mouth daily with breakfast., Disp: 90 capsule, Rfl: 1   Objective:     There were no vitals filed for this visit.    There is no height or weight on file to calculate BMI.    Physical  Exam:    ***   Electronically signed by:  Stephen Sexton D.[name] 4:27 PM 03/19/22

## 2022-03-20 ENCOUNTER — Ambulatory Visit (INDEPENDENT_AMBULATORY_CARE_PROVIDER_SITE_OTHER): Payer: 59 | Admitting: Sports Medicine

## 2022-03-20 VITALS — HR 94 | Ht 70.0 in | Wt 234.0 lb

## 2022-03-20 DIAGNOSIS — M9907 Segmental and somatic dysfunction of upper extremity: Secondary | ICD-10-CM

## 2022-03-20 DIAGNOSIS — M25511 Pain in right shoulder: Secondary | ICD-10-CM | POA: Diagnosis not present

## 2022-03-20 DIAGNOSIS — M9901 Segmental and somatic dysfunction of cervical region: Secondary | ICD-10-CM

## 2022-03-20 DIAGNOSIS — M9903 Segmental and somatic dysfunction of lumbar region: Secondary | ICD-10-CM

## 2022-03-20 DIAGNOSIS — M9902 Segmental and somatic dysfunction of thoracic region: Secondary | ICD-10-CM

## 2022-03-20 DIAGNOSIS — M9905 Segmental and somatic dysfunction of pelvic region: Secondary | ICD-10-CM

## 2022-04-16 NOTE — Progress Notes (Unsigned)
Stephen Sexton D.[place] Phone: [phone]   Assessment and Plan:     There are no diagnoses linked to this encounter.  *** - Patient has received significant relief with OMT in the past.  Elects for repeat OMT today.  Tolerated well per note below. - Decision today to treat with OMT was based on Physical Exam   After verbal consent patient was treated with HVLA (high velocity low amplitude), ME (muscle energy), FPR (flex positional release), ST (soft tissue), PC/PD (Pelvic Compression/ Pelvic Decompression) techniques in cervical, rib, thoracic, lumbar, and pelvic areas. Patient tolerated the procedure well with improvement in symptoms.  Patient educated on potential side effects of soreness and recommended to rest, hydrate, and use Tylenol as needed for pain control.   Pertinent previous records reviewed include ***   Follow Up: ***     Subjective:   I, Stephen Sexton, am serving as a Stephen Sexton for Stephen Sexton Stephen Sexton  Chief Complaint: OMT follow up    HPI:  01/22/21 Patient is a 22 year old male presenting with neck and back pain. Patient last saw Stephen Sexton on 11/15/2020 for this reason and had OMT. Today patient states that he is here for maintenance OMT.     02/19/2021 Patient states after the last OMT visit the following week the neck got tight after laying on a friends bed gave him a crick in his neck for the next two days , back and neck still feel tight    03/25/2021 Patient states that he's feeling pretty good pain in his right shoulder blade.  States that he has felt good generally for about 4 weeks in between appointments, however going longer than 4 weeks he notices that pain started to return.  No new trauma or symptoms.   04/22/2021 Patient states that his hips have been hurting him the last couple of days, R shoulder has been giving him some problems 04/03/2021.    05/20/2021 Patient states that hes  doing good has some pain in his shoulders and hips    06/20/2021 Patient states that hes doing good   07/04/2021 Patient states that his back has been hurting and its radiating down to his hip and down his leg , no numbness tingling   5/11/023 Patient states he pulled a muscle in his back Tuesday had pain all down his leg hurts a little not as bad as it did on Tuesday    08/16/2021 Patient states that lower back and neck is tight    09/12/2021 Patient states that he is good just a tune up    10/21/2021 Patient states normal , thinks he did something to hip yesterday has pain from lateral hip to ankle   9/21/023 Patient states he is good left hip had been hurting a week ago but it hasn't hurt since    12/26/2021 Patient states that he is pretty good just needs a tune up    01/23/2022 Patient states back pain this week    02/20/2022 Patient states had some pain in his right hip and lower back   03/20/2022 Patient states he pretty good , shoulder pain and lower back    04/17/2022 Patient states    Relevant Historical Information: None pertinent  Additional pertinent review of systems negative.  Current Outpatient Medications  Medication Sig Dispense Refill   diclofenac (VOLTAREN) 75 MG EC tablet Take 1 tablet (75 mg total) by mouth 2 (two)  times daily as needed. 30 tablet 5   hydrOXYzine (VISTARIL) 25 MG capsule Take 1 capsule (25 mg total) by mouth 2 (two) times daily as needed. 180 capsule 1   levocetirizine (XYZAL) 5 MG tablet Take 1 tablet (5 mg total) by mouth every evening. 90 tablet 1   montelukast (SINGULAIR) 10 MG tablet Take 1 tablet (10 mg total) by mouth at bedtime. 90 tablet 1   ondansetron (ZOFRAN) 8 MG tablet Take 1 tablet (8 mg total) by mouth every 8 (eight) hours as needed for nausea or vomiting. 20 tablet 0   pantoprazole (PROTONIX) 20 MG tablet Take 1 tablet (20 mg total) by mouth daily as needed (Acid Reflux). 90 tablet 1   propranolol (INDERAL) 20 MG tablet  Take 1 tablet (20 mg total) by mouth 2 (two) times daily. 180 tablet 1   QUEtiapine (SEROQUEL) 100 MG tablet Take 0.5 tablets (50 mg total) by mouth at bedtime. 45 tablet 1   tiZANidine (ZANAFLEX) 2 MG tablet Take 1 tablet (2 mg total) by mouth at bedtime. (Patient taking differently: Take 2 mg by mouth at bedtime as needed.) 30 tablet 0   venlafaxine XR (EFFEXOR XR) 75 MG 24 hr capsule Take 1 capsule (75 mg total) by mouth daily with breakfast. 90 capsule 1   No current facility-administered medications for this visit.      Objective:     There were no vitals filed for this visit.    There is no height or weight on file to calculate BMI.    Physical Exam:     General: Well-appearing, cooperative, sitting comfortably in no acute distress.   OMT Physical Exam:  ASIS Compression Test: Positive Right Cervical: TTP paraspinal, *** Rib: Bilateral elevated first rib with TTP Thoracic: TTP paraspinal,*** Lumbar: TTP paraspinal,*** Pelvis: Right anterior innominate  Electronically signed by:  [facility] 7:40 AM 04/16/22

## 2022-04-17 ENCOUNTER — Ambulatory Visit (INDEPENDENT_AMBULATORY_CARE_PROVIDER_SITE_OTHER): Payer: 59 | Admitting: Sports Medicine

## 2022-04-17 VITALS — BP 120/82 | HR 77 | Ht 70.0 in | Wt 232.0 lb

## 2022-04-17 DIAGNOSIS — M545 Low back pain, unspecified: Secondary | ICD-10-CM

## 2022-04-17 DIAGNOSIS — M9902 Segmental and somatic dysfunction of thoracic region: Secondary | ICD-10-CM

## 2022-04-17 DIAGNOSIS — M9903 Segmental and somatic dysfunction of lumbar region: Secondary | ICD-10-CM | POA: Diagnosis not present

## 2022-04-17 DIAGNOSIS — M9908 Segmental and somatic dysfunction of rib cage: Secondary | ICD-10-CM

## 2022-04-17 DIAGNOSIS — M9905 Segmental and somatic dysfunction of pelvic region: Secondary | ICD-10-CM | POA: Diagnosis not present

## 2022-04-17 DIAGNOSIS — G8929 Other chronic pain: Secondary | ICD-10-CM

## 2022-04-17 DIAGNOSIS — M9901 Segmental and somatic dysfunction of cervical region: Secondary | ICD-10-CM | POA: Diagnosis not present

## 2022-04-17 NOTE — Patient Instructions (Signed)
Good to see you   

## 2022-05-14 NOTE — Progress Notes (Deleted)
Stephen Sexton D.Stephen Sexton [place] Phone: [phone]   Assessment and Plan:     There are no diagnoses linked to this encounter.  *** - Patient has received significant relief with OMT in the past.  Elects for repeat OMT today.  Tolerated well per note below. - Decision today to treat with OMT was based on Physical Exam   After verbal consent patient was treated with HVLA (high velocity low amplitude), ME (muscle energy), FPR (flex positional release), ST (soft tissue), PC/PD (Pelvic Compression/ Pelvic Decompression) techniques in cervical, rib, thoracic, lumbar, and pelvic areas. Patient tolerated the procedure well with improvement in symptoms.  Patient educated on potential side effects of soreness and recommended to rest, hydrate, and use Tylenol as needed for pain control.   Pertinent previous records reviewed include ***   Follow Up: ***     Subjective:   I, Gracia Saggese, am serving as a Education administrator for Doctor Glennon Mac  Chief Complaint: OMT follow up    HPI:  01/22/21 Patient is a 22 year old male presenting with neck and back pain. Patient last saw Dr. Tamala Julian on 11/15/2020 for this reason and had OMT. Today patient states that he is here for maintenance OMT.     02/19/2021 Patient states after the last OMT visit the following week the neck got tight after laying on a friends bed gave him a crick in his neck for the next two days , back and neck still feel tight    03/25/2021 Patient states that he's feeling pretty good pain in his right shoulder blade.  States that he has felt good generally for about 4 weeks in between appointments, however going longer than 4 weeks he notices that pain started to return.  No new trauma or symptoms.   04/22/2021 Patient states that his hips have been hurting him the last couple of days, R shoulder has been giving him some problems 04/03/2021.    05/20/2021 Patient states that hes  doing good has some pain in his shoulders and hips    06/20/2021 Patient states that hes doing good   07/04/2021 Patient states that his back has been hurting and its radiating down to his hip and down his leg , no numbness tingling   5/11/023 Patient states he pulled a muscle in his back Tuesday had pain all down his leg hurts a little not as bad as it did on Tuesday    08/16/2021 Patient states that lower back and neck is tight    09/12/2021 Patient states that he is good just a tune up    10/21/2021 Patient states normal , thinks he did something to hip yesterday has pain from lateral hip to ankle   9/21/023 Patient states he is good left hip had been hurting a week ago but it hasn't hurt since    12/26/2021 Patient states that he is pretty good just needs a tune up    01/23/2022 Patient states back pain this week    02/20/2022 Patient states had some pain in his right hip and lower back   03/20/2022 Patient states he pretty good , shoulder pain and lower back    04/17/2022 Patient states hips have been in pain   05/15/2022 Patient states    Relevant Historical Information: None pertinent Additional pertinent review of systems negative.  Current Outpatient Medications  Medication Sig Dispense Refill   diclofenac (VOLTAREN) 75 MG EC tablet Take  1 tablet (75 mg total) by mouth 2 (two) times daily as needed. 30 tablet 5   hydrOXYzine (VISTARIL) 25 MG capsule Take 1 capsule (25 mg total) by mouth 2 (two) times daily as needed. 180 capsule 1   levocetirizine (XYZAL) 5 MG tablet Take 1 tablet (5 mg total) by mouth every evening. 90 tablet 1   montelukast (SINGULAIR) 10 MG tablet Take 1 tablet (10 mg total) by mouth at bedtime. 90 tablet 1   ondansetron (ZOFRAN) 8 MG tablet Take 1 tablet (8 mg total) by mouth every 8 (eight) hours as needed for nausea or vomiting. 20 tablet 0   pantoprazole (PROTONIX) 20 MG tablet Take 1 tablet (20 mg total) by mouth daily as needed (Acid Reflux).  90 tablet 1   propranolol (INDERAL) 20 MG tablet Take 1 tablet (20 mg total) by mouth 2 (two) times daily. 180 tablet 1   QUEtiapine (SEROQUEL) 100 MG tablet Take 0.5 tablets (50 mg total) by mouth at bedtime. 45 tablet 1   tiZANidine (ZANAFLEX) 2 MG tablet Take 1 tablet (2 mg total) by mouth at bedtime. (Patient taking differently: Take 2 mg by mouth at bedtime as needed.) 30 tablet 0   venlafaxine XR (EFFEXOR XR) 75 MG 24 hr capsule Take 1 capsule (75 mg total) by mouth daily with breakfast. 90 capsule 1   No current facility-administered medications for this visit.      Objective:     There were no vitals filed for this visit.    There is no height or weight on file to calculate BMI.    Physical Exam:     General: Well-appearing, cooperative, sitting comfortably in no acute distress.   OMT Physical Exam:  ASIS Compression Test: Positive Right Cervical: TTP paraspinal, *** Rib: Bilateral elevated first rib with TTP Thoracic: TTP paraspinal,*** Lumbar: TTP paraspinal,*** Pelvis: Right anterior innominate  Electronically signed by:  Stephen Sexton D.[name] 8:46 AM 05/14/22

## 2022-05-15 ENCOUNTER — Ambulatory Visit: Payer: 59 | Admitting: Sports Medicine

## 2022-05-21 NOTE — Progress Notes (Signed)
Stephen Sexton D.[place] Phone: [phone]   Assessment and Plan:    1. Chronic bilateral low back pain without sciatica 2. Somatic dysfunction of cervical region 3. Somatic dysfunction of thoracic region 4. Somatic dysfunction of lumbar region 5. Somatic dysfunction of pelvic region 6. Somatic dysfunction of rib region  -Chronic with exacerbation, subsequent visit - Recurrence of multiple musculoskeletal complaints with most prominent being in low back and bilateral hips.  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  Elects for repeat OMT today.  Tolerated well per note below. - Decision today to treat with OMT was based on Physical Exam   After verbal consent patient was treated with HVLA (high velocity low amplitude), ME (muscle energy), FPR (flex positional release), ST (soft tissue), PC/PD (Pelvic Compression/ Pelvic Decompression) techniques in cervical, rib, thoracic, lumbar, and pelvic areas. Patient tolerated the procedure well with improvement in symptoms.  Patient educated on potential side effects of soreness and recommended to rest, hydrate, and use Tylenol as needed for pain control.   Pertinent previous records reviewed include none   Follow Up: 4 weeks for reevaluation.  Could consider repeat OMT   Subjective:   I, Stephen Sexton, am serving as a Education administrator for Stephen Sexton  Chief Complaint: OMT follow up    HPI:  01/22/21 Patient is a 22 year old male presenting with neck and back pain. Patient last saw Stephen Sexton on 11/15/2020 for this reason and had OMT. Today patient states that he is here for maintenance OMT.     02/19/2021 Patient states after the last OMT visit the following week the neck got tight after laying on a friends bed gave him a crick in his neck for the next two days , back and neck still feel tight     03/25/2021 Patient states that he's feeling pretty good pain in his right shoulder blade.  States that he has felt good generally for about 4 weeks in between appointments, however going longer than 4 weeks he notices that pain started to return.  No new trauma or symptoms.   04/22/2021 Patient states that his hips have been hurting him the last couple of days, R shoulder has been giving him some problems 04/03/2021.    05/20/2021 Patient states that hes doing good has some pain in his shoulders and hips    06/20/2021 Patient states that hes doing good   07/04/2021 Patient states that his back has been hurting and its radiating down to his hip and down his leg , no numbness tingling   5/11/023 Patient states he pulled a muscle in his back Tuesday had pain all down his leg hurts a little not as bad as it did on Tuesday    08/16/2021 Patient states that lower back and neck is tight    09/12/2021 Patient states that he is good just a tune up    10/21/2021 Patient states normal , thinks he did something to hip yesterday has pain from lateral hip to ankle   9/21/023 Patient states he is good left hip had been hurting a week ago but it hasn't hurt since    12/26/2021 Patient states that he is pretty good just needs a tune up    01/23/2022 Patient states back pain this week    02/20/2022 Patient states had some pain in his right hip and lower  back   03/20/2022 Patient states he pretty good , shoulder pain and lower back    04/17/2022 Patient states hips have been in pain    05/28/2022 Patient states hips and back are flared been caring for 2 babies     Relevant Historical Information: None pertinent  Additional pertinent review of systems negative.  Current Outpatient Medications  Medication Sig Dispense Refill   diclofenac (VOLTAREN) 75 MG EC tablet Take 1 tablet (75 mg total) by mouth 2 (two) times daily as needed. 30 tablet 5   hydrOXYzine (VISTARIL) 25 MG capsule Take 1  capsule (25 mg total) by mouth 2 (two) times daily as needed. 180 capsule 1   levocetirizine (XYZAL) 5 MG tablet Take 1 tablet (5 mg total) by mouth every evening. 90 tablet 1   montelukast (SINGULAIR) 10 MG tablet Take 1 tablet (10 mg total) by mouth at bedtime. 90 tablet 1   ondansetron (ZOFRAN) 8 MG tablet Take 1 tablet (8 mg total) by mouth every 8 (eight) hours as needed for nausea or vomiting. 20 tablet 0   pantoprazole (PROTONIX) 20 MG tablet Take 1 tablet (20 mg total) by mouth daily as needed (Acid Reflux). 90 tablet 1   propranolol (INDERAL) 20 MG tablet Take 1 tablet (20 mg total) by mouth 2 (two) times daily. 180 tablet 1   QUEtiapine (SEROQUEL) 100 MG tablet Take 0.5 tablets (50 mg total) by mouth at bedtime. 45 tablet 1   tiZANidine (ZANAFLEX) 2 MG tablet Take 1 tablet (2 mg total) by mouth at bedtime. (Patient taking differently: Take 2 mg by mouth at bedtime as needed.) 30 tablet 0   venlafaxine XR (EFFEXOR XR) 75 MG 24 hr capsule Take 1 capsule (75 mg total) by mouth daily with breakfast. 90 capsule 1   No current facility-administered medications for this visit.      Objective:     Vitals:   05/28/22 1506  Pulse: 94  SpO2: 98%  Weight: 220 lb (99.8 kg)  Height: 5\' 10"  (1.778 m)      Body mass index is 31.57 kg/m.    Physical Exam:     General: Well-appearing, cooperative, sitting comfortably in no acute distress.   OMT Physical Exam:  ASIS Compression Test: Positive Right Cervical: TTP paraspinal, C3-5 RLSL Rib: Left elevated first rib with TTP Thoracic: TTP paraspinal, T5-7 RRSR Lumbar: TTP paraspinal, L1-3 RRSL, L5 RL Pelvis: Right anterior innominate  Electronically signed by:  Stephen Sexton D.[name] 4:25 PM 05/28/22

## 2022-05-28 ENCOUNTER — Ambulatory Visit (INDEPENDENT_AMBULATORY_CARE_PROVIDER_SITE_OTHER): Payer: 59 | Admitting: Sports Medicine

## 2022-05-28 VITALS — HR 94 | Ht 70.0 in | Wt 220.0 lb

## 2022-05-28 DIAGNOSIS — M9901 Segmental and somatic dysfunction of cervical region: Secondary | ICD-10-CM | POA: Diagnosis not present

## 2022-05-28 DIAGNOSIS — M9908 Segmental and somatic dysfunction of rib cage: Secondary | ICD-10-CM

## 2022-05-28 DIAGNOSIS — M9905 Segmental and somatic dysfunction of pelvic region: Secondary | ICD-10-CM

## 2022-05-28 DIAGNOSIS — M9902 Segmental and somatic dysfunction of thoracic region: Secondary | ICD-10-CM

## 2022-05-28 DIAGNOSIS — G8929 Other chronic pain: Secondary | ICD-10-CM | POA: Diagnosis not present

## 2022-05-28 DIAGNOSIS — M9903 Segmental and somatic dysfunction of lumbar region: Secondary | ICD-10-CM | POA: Diagnosis not present

## 2022-05-28 DIAGNOSIS — M545 Low back pain, unspecified: Secondary | ICD-10-CM

## 2022-05-28 NOTE — Patient Instructions (Addendum)
Good to see you  4 week follow up   

## 2022-06-24 NOTE — Progress Notes (Unsigned)
[name] D.[name] [address] [place] [address] Phone: [phone]   Assessment and Plan:     There are no diagnoses linked to this encounter.  *** - Patient has received significant relief with OMT in the past.  Elects for repeat OMT today.  Tolerated well per note below. - Decision today to treat with OMT was based on Physical Exam   After verbal consent patient was treated with HVLA (high velocity low amplitude), ME (muscle energy), FPR (flex positional release), ST (soft tissue), PC/PD (Pelvic Compression/ Pelvic Decompression) techniques in cervical, rib, thoracic, lumbar, and pelvic areas. Patient tolerated the procedure well with improvement in symptoms.  Patient educated on potential side effects of soreness and recommended to rest, hydrate, and use Tylenol as needed for pain control.   Pertinent previous records reviewed include ***   Follow Up: ***     Subjective:   I, Stephen Sexton, am serving as a Neurosurgeon for Doctor Stephen Sexton  Chief Complaint: OMT follow up    HPI:  01/22/21 Patient is a 22 year old male presenting with neck and back pain. Patient last saw Dr. Katrinka Sexton on 11/15/2020 for this reason and had OMT. Today patient states that he is here for maintenance OMT.     02/19/2021 Patient states after the last OMT visit the following week the neck got tight after laying on a friends bed gave him a crick in his neck for the next two days , back and neck still feel tight    03/25/2021 Patient states that he's feeling pretty good pain in his right shoulder blade.  States that he has felt good generally for about 4 weeks in between appointments, however going longer than 4 weeks he notices that pain started to return.  No new trauma or symptoms.   04/22/2021 Patient states that his hips have been hurting him the last couple of days, R shoulder has been giving him some problems 04/03/2021.    05/20/2021 Patient states that hes  doing good has some pain in his shoulders and hips    06/20/2021 Patient states that hes doing good   07/04/2021 Patient states that his back has been hurting and its radiating down to his hip and down his leg , no numbness tingling   5/11/023 Patient states he pulled a muscle in his back Tuesday had pain all down his leg hurts a little not as bad as it did on Tuesday    08/16/2021 Patient states that lower back and neck is tight    09/12/2021 Patient states that he is good just a tune up    10/21/2021 Patient states normal , thinks he did something to hip yesterday has pain from lateral hip to ankle   9/21/023 Patient states he is good left hip had been hurting a week ago but it hasn't hurt since    12/26/2021 Patient states that he is pretty good just needs a tune up    01/23/2022 Patient states back pain this week    02/20/2022 Patient states had some pain in his right hip and lower back   03/20/2022 Patient states he pretty good , shoulder pain and lower back    04/17/2022 Patient states hips have been in pain    05/28/2022 Patient states hips and back are flared been caring for 2 babies   06/25/2022 Patient states      Relevant Historical Information: None pertinent    Additional pertinent review  of systems negative.  Current Outpatient Medications  Medication Sig Dispense Refill   diclofenac (VOLTAREN) 75 MG EC tablet Take 1 tablet (75 mg total) by mouth 2 (two) times daily as needed. 30 tablet 5   hydrOXYzine (VISTARIL) 25 MG capsule Take 1 capsule (25 mg total) by mouth 2 (two) times daily as needed. 180 capsule 1   levocetirizine (XYZAL) 5 MG tablet Take 1 tablet (5 mg total) by mouth every evening. 90 tablet 1   montelukast (SINGULAIR) 10 MG tablet Take 1 tablet (10 mg total) by mouth at bedtime. 90 tablet 1   ondansetron (ZOFRAN) 8 MG tablet Take 1 tablet (8 mg total) by mouth every 8 (eight) hours as needed for nausea or vomiting. 20 tablet 0   pantoprazole  (PROTONIX) 20 MG tablet Take 1 tablet (20 mg total) by mouth daily as needed (Acid Reflux). 90 tablet 1   propranolol (INDERAL) 20 MG tablet Take 1 tablet (20 mg total) by mouth 2 (two) times daily. 180 tablet 1   QUEtiapine (SEROQUEL) 100 MG tablet Take 0.5 tablets (50 mg total) by mouth at bedtime. 45 tablet 1   tiZANidine (ZANAFLEX) 2 MG tablet Take 1 tablet (2 mg total) by mouth at bedtime. (Patient taking differently: Take 2 mg by mouth at bedtime as needed.) 30 tablet 0   venlafaxine XR (EFFEXOR XR) 75 MG 24 hr capsule Take 1 capsule (75 mg total) by mouth daily with breakfast. 90 capsule 1   No current facility-administered medications for this visit.      Objective:     There were no vitals filed for this visit.    There is no height or weight on file to calculate BMI.    Physical Exam:     General: Well-appearing, cooperative, sitting comfortably in no acute distress.   OMT Physical Exam:  ASIS Compression Test: Positive Right Cervical: TTP paraspinal, *** Rib: Bilateral elevated first rib with TTP Thoracic: TTP paraspinal,*** Lumbar: TTP paraspinal,*** Pelvis: Right anterior innominate  Electronically signed by:  [name] D.[name] 7:48 AM 06/24/22

## 2022-06-25 ENCOUNTER — Ambulatory Visit (INDEPENDENT_AMBULATORY_CARE_PROVIDER_SITE_OTHER): Payer: 59 | Admitting: Sports Medicine

## 2022-06-25 VITALS — HR 94 | Ht 70.0 in | Wt 215.0 lb

## 2022-06-25 DIAGNOSIS — M7651 Patellar tendinitis, right knee: Secondary | ICD-10-CM | POA: Diagnosis not present

## 2022-06-25 MED ORDER — MELOXICAM 15 MG PO TABS: 15.0000 mg | ORAL_TABLET | Freq: Every day | ORAL | 0 refills | Status: DC

## 2022-06-25 NOTE — Patient Instructions (Addendum)
Good to see you  - Start meloxicam 15 mg daily x2 weeks.  If still having pain after 2 weeks, complete 3rd-week of meloxicam. May use remaining meloxicam as needed once daily for pain control.  Do not to use additional NSAIDs while taking meloxicam.  May use Tylenol [phone] mg 2 to 3 times a day for breakthrough pain. Knee HEP  3-4 week follow up  Recommend getting a patellar knee strap, [employer], [employer] or anywhere that sells them

## 2022-07-11 ENCOUNTER — Ambulatory Visit (HOSPITAL_COMMUNITY)
Admission: RE | Admit: 2022-07-11 | Discharge: 2022-07-11 | Disposition: A | Discharge status: Home / Self Care | Payer: 59 | Source: Ambulatory Visit | Attending: Physician Assistant | Admitting: Physician Assistant

## 2022-07-11 ENCOUNTER — Ambulatory Visit (INDEPENDENT_AMBULATORY_CARE_PROVIDER_SITE_OTHER): Payer: 59

## 2022-07-11 ENCOUNTER — Encounter (HOSPITAL_COMMUNITY): Payer: Self-pay

## 2022-07-11 VITALS — BP 124/74 | HR 104 | Temp 98.6°F | Resp 20 | Ht 70.0 in | Wt 210.0 lb

## 2022-07-11 DIAGNOSIS — J069 Acute upper respiratory infection, unspecified: Secondary | ICD-10-CM

## 2022-07-11 DIAGNOSIS — R062 Wheezing: Secondary | ICD-10-CM

## 2022-07-11 MED ORDER — ALBUTEROL SULFATE HFA 108 (90 BASE) MCG/ACT IN AERS: 1.0000 | INHALATION_SPRAY | Freq: Four times a day (QID) | RESPIRATORY_TRACT | 0 refills | Status: DC | PRN

## 2022-07-11 MED ORDER — IPRATROPIUM-ALBUTEROL 0.5-2.5 (3) MG/3ML IN SOLN
RESPIRATORY_TRACT | Status: AC
  Filled 2022-07-11: qty 3

## 2022-07-11 MED ORDER — IPRATROPIUM-ALBUTEROL 0.5-2.5 (3) MG/3ML IN SOLN
3.0000 mL | Freq: Once | RESPIRATORY_TRACT | Status: AC
  Administered 2022-07-11: 3 mL via RESPIRATORY_TRACT

## 2022-07-11 NOTE — Discharge Instructions (Signed)
Recommend quit smoking due to lung changes already developing on CXR  Take medication as prescribed

## 2022-07-11 NOTE — ED Triage Notes (Signed)
Chief Complaint: Cough, SOB, Fever, chesty congestion.   Onset: 1 week and worse Monday   Prescriptions or OTC medications tried: Yes- Ibuprofen, Mucinex     with little relief  Sick exposure: Yes- His family but they are better , no testing done.   New foods, medications, or products: No  Recent Travel: No

## 2022-07-11 NOTE — ED Provider Notes (Signed)
[facility]    CSN: [id number] Arrival date & time: 07/11/22  5366      History   Chief Complaint Chief Complaint  Patient presents with   Cough   Fever   Appointment    HPI Stephen Sexton is a 22 y.o. male.   Patient here for evaluation of cough x 1 week.  Worse the last 4 days . He notes new onset fever x 2 days ago, tmax 100.3 yesterday.  He admits nasal congestion, rhinorrhea, wheezing, SOB.  He denies history of Asthma.  He is taking ibuprofen and mucinex w/o relief.  Entire family sick with similar sx, though they have all improved and he has worsened.    Past Medical History:  Diagnosis Date   Abdominal migraine 09/08/2017   Acid reflux    ADHD    Anxiety    Cyclic vomiting syndrome    Depression    Dysphonia 04/14/2017   Environmental and seasonal allergies    Tension headache 12/26/2016    Patient Active Problem List   Diagnosis Date Noted   Somatic dysfunction of spine, lumbar 10/18/2020   Slow transit constipation 10/08/2020   Spine curvature, acquired 09/17/2020   Lumbar pain 09/17/2020   Depression with anxiety 10/24/2019   Allergic rhinitis due to pollen 10/24/2019   Cyclic vomiting syndrome 10/24/2019   Insomnia 10/24/2019   Gastroesophageal reflux disease without esophagitis 10/24/2019   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 Authorizing Provider  albuterol (VENTOLIN HFA) 108 (90 Base) MCG/ACT inhaler Inhale 1-2 puffs into the lungs every 6 (six) hours as needed for wheezing or shortness of breath. 07/11/22  Yes Evern Core, PA-C  diclofenac (VOLTAREN) 75 MG EC tablet Take 1 tablet (75 mg total) by mouth 2 (two) times daily as needed. 01/08/21   Kuneff, Renee A, DO  hydrOXYzine (VISTARIL) 25 MG capsule Take 1 capsule (25 mg  total) by mouth 2 (two) times daily as needed. 01/08/21   Kuneff, Renee A, DO  levocetirizine (XYZAL) 5 MG tablet Take 1 tablet (5 mg total) by mouth every evening. 01/08/21   Kuneff, Renee A, DO  meloxicam (MOBIC) 15 MG tablet Take 1 tablet (15 mg total) by mouth daily. 06/25/22   Richardean Sale, DO  montelukast (SINGULAIR) 10 MG tablet Take 1 tablet (10 mg total) by mouth at bedtime. 01/08/21   Kuneff, Renee A, DO  ondansetron (ZOFRAN) 8 MG tablet Take 1 tablet (8 mg total) by mouth every 8 (eight) hours as needed for nausea or vomiting. 09/23/20   Mancel Bale, MD  pantoprazole (PROTONIX) 20 MG tablet Take 1 tablet (20 mg total) by mouth daily as needed (Acid Reflux). 01/08/21   Kuneff, Renee A, DO  propranolol (INDERAL) 20 MG tablet Take 1 tablet (20 mg total) by mouth 2 (two) times daily. 01/08/21   Kuneff, Renee A, DO  QUEtiapine (SEROQUEL) 100 MG tablet Take 0.5 tablets (50 mg total) by mouth at bedtime. 01/08/21   Kuneff, Renee A, DO  tiZANidine (ZANAFLEX) 2 MG tablet Take 1 tablet (2 mg total) by mouth at bedtime. Patient taking differently: Take 2 mg by mouth at bedtime as needed. 10/18/20   Judi Saa, DO  venlafaxine XR (EFFEXOR XR) 75 MG 24 hr capsule  Take 1 capsule (75 mg total) by mouth daily with breakfast. 01/08/21   Claiborne Billings, Ezequiel Essex, DO    Family History Family History  Problem Relation Age of Onset   Arthritis Mother    Diabetes Mother    Hypertension Mother    Hyperlipidemia Mother    Arthritis Father    Drug abuse Brother    Arthritis Maternal Grandmother    Cancer Maternal Grandmother    Depression Maternal Grandmother    Diabetes Maternal Grandmother    Hyperlipidemia Maternal Grandmother    Hypertension Maternal Grandmother    Miscarriages / Stillbirths Maternal Grandmother    Arthritis Maternal Grandfather    Cancer Maternal Grandfather    Diabetes Maternal Grandfather    Heart attack Maternal Grandfather    Heart disease Maternal Grandfather     Hyperlipidemia Maternal Grandfather    Hypertension Maternal Grandfather    Miscarriages / Stillbirths Paternal Grandmother    Stroke Paternal Grandmother    Hypertension Paternal Grandmother    Diabetes Paternal Grandmother    Cancer Paternal Grandmother    Cancer Paternal Grandfather    Diabetes Paternal Grandfather     Social History Social History   Tobacco Use   Smoking status: Never   Smokeless tobacco: Never  Vaping Use   Vaping Use: Never used  Substance Use Topics   Alcohol use: Not Currently   Drug use: Never     Allergies   Amitriptyline   Review of Systems Review of Systems  Constitutional:  Positive for fever. Negative for chills and fatigue.  HENT:  Positive for congestion, ear pain and rhinorrhea. Negative for facial swelling, nosebleeds, postnasal drip, sinus pressure, sinus pain and sore throat.   Eyes:  Negative for pain and redness.  Respiratory:  Positive for cough, shortness of breath and wheezing.   Gastrointestinal:  Negative for abdominal pain, diarrhea, nausea and vomiting.  Musculoskeletal:  Negative for arthralgias and myalgias.  Skin:  Negative for rash.  Neurological:  Negative for light-headedness and headaches.  Hematological:  Negative for adenopathy. Does not bruise/bleed easily.  Psychiatric/Behavioral:  Positive for sleep disturbance. Negative for confusion.      Physical Exam Triage Vital Signs ED Triage Vitals  Enc Vitals Group     BP 07/11/22 0930 124/74     Pulse Rate 07/11/22 0930 (!) 104     Resp 07/11/22 0930 20     Temp 07/11/22 0930 98.6 F (37 C)     Temp Source 07/11/22 0930 Oral     SpO2 07/11/22 0930 94 %     Weight 07/11/22 0929 210 lb (95.3 kg)     Height 07/11/22 0929 5\' 10"  (1.778 m)     Head Circumference --      Peak Flow --      Pain Score 07/11/22 0927 0     Pain Loc --      Pain Edu? --      Excl. in GC? --    No data found.  Updated Vital Signs BP 124/74 (BP Location: Right Arm)   Pulse (!)  104   Temp 98.6 F (37 C) (Oral)   Resp 20   Ht 5\' 10"  (1.778 m)   Wt 210 lb (95.3 kg)   SpO2 94%   BMI 30.13 kg/m   Visual Acuity Right Eye Distance:   Left Eye Distance:   Bilateral Distance:    Right Eye Near:   Left Eye Near:    Bilateral Near:  Physical Exam Vitals and nursing note reviewed.  Constitutional:      General: He is not in acute distress.    Appearance: Normal appearance. He is not ill-appearing.  HENT:     Head: Normocephalic and atraumatic.     Right Ear: Tympanic membrane and ear canal normal.     Left Ear: Tympanic membrane and ear canal normal.     Nose: Mucosal edema, congestion and rhinorrhea present. Rhinorrhea is clear.     Mouth/Throat:     Pharynx: Uvula midline. No oropharyngeal exudate or posterior oropharyngeal erythema.     Comments: Cobblestone appearance posterior pharynx Eyes:     General: No scleral icterus.    Extraocular Movements: Extraocular movements intact.     Conjunctiva/sclera: Conjunctivae normal.  Cardiovascular:     Rate and Rhythm: Regular rhythm. Tachycardia present.     Heart sounds: No murmur heard. Pulmonary:     Effort: Pulmonary effort is normal. No respiratory distress.     Breath sounds: Wheezing (b/l, throughout) present. No rales.  Musculoskeletal:     Cervical back: Normal range of motion. No rigidity.  Lymphadenopathy:     Cervical: No cervical adenopathy.  Skin:    Coloration: Skin is not jaundiced.     Findings: No rash.  Neurological:     General: No focal deficit present.     Mental Status: He is alert and oriented to person, place, and time.     Motor: No weakness.     Gait: Gait normal.  Psychiatric:        Mood and Affect: Mood normal.        Behavior: Behavior normal.      UC Treatments / Results  Labs (all labs ordered are listed, but only abnormal results are displayed) Labs Reviewed - No data to display  EKG   Radiology DG Chest 2 View  Result Date: 07/11/2022 CLINICAL  DATA:  Cough and shortness of breath for 5 days, chest congestion, new onset fever EXAM: CHEST - 2 VIEW COMPARISON:  04/30/2017 FINDINGS: Normal heart size, mediastinal contours, and pulmonary vascularity. Chronic peribronchial thickening. No pulmonary infiltrate, pleural effusion, or pneumothorax. Osseous structures unremarkable. IMPRESSION: Chronic bronchitic changes without acute abnormalities. Electronically Signed   By: Ulyses Southward M.D.   On: 07/11/2022 10:15    Procedures Procedures (including critical care time)  Medications Ordered in UC Medications  ipratropium-albuterol (DUONEB) 0.5-2.5 (3) MG/3ML nebulizer solution 3 mL (3 mLs Nebulization Given 07/11/22 1037)    Initial Impression / Assessment and Plan / UC Course  I have reviewed the triage vital signs and the nursing notes.  Pertinent labs & imaging results that were available during my care of the patient were reviewed by me and considered in my medical decision making (see chart for details).     Patient reports sx resolution after duo neb Albuterol sent to pharmacy, educated patient on proper use of inhalers Follow up with PCP Smoking cessation counseling provided Final Clinical Impressions(s) / UC Diagnoses   Final diagnoses:  Viral upper respiratory tract infection     Discharge Instructions      Recommend quit smoking due to lung changes already developing on CXR  Take medication as prescribed     ED Prescriptions     Medication Sig Dispense Auth. Provider   albuterol (VENTOLIN HFA) 108 (90 Base) MCG/ACT inhaler Inhale 1-2 puffs into the lungs every 6 (six) hours as needed for wheezing or shortness of breath. 18 g Evern Core, PA-C  PDMP not reviewed this encounter.   Evern Core, PA-C 07/11/22 1055

## 2022-07-15 NOTE — Progress Notes (Unsigned)
[name] [name] [address] [place] [address] Phone: [phone]   Assessment and Plan:     There are no diagnoses linked to this encounter.  *** - Patient has received significant relief with OMT in the past.  Elects for repeat OMT today.  Tolerated well per note below. - Decision today to treat with OMT was based on Physical Exam   After verbal consent patient was treated with HVLA (high velocity low amplitude), ME (muscle energy), FPR (flex positional release), ST (soft tissue), PC/PD (Pelvic Compression/ Pelvic Decompression) techniques in cervical, rib, thoracic, lumbar, and pelvic areas. Patient tolerated the procedure well with improvement in symptoms.  Patient educated on potential side effects of soreness and recommended to rest, hydrate, and use Tylenol as needed for pain control.   Pertinent previous records reviewed include ***   Follow Up: ***     Subjective:   I, Ieesha Abbasi, am serving as a Neurosurgeon for [name]  Chief Complaint: OMT follow up    HPI:  01/22/21 Patient is a 22 year old male presenting with neck and back pain. Patient last saw [name] on 11/15/2020 for this reason and had OMT. Today patient states that he is here for maintenance OMT.     02/19/2021 Patient states after the last OMT visit the following week the neck got tight after laying on a friends bed gave him a crick in his neck for the next two days , back and neck still feel tight    03/25/2021 Patient states that he's feeling pretty good pain in his right shoulder blade.  States that he has felt good generally for about 4 weeks in between appointments, however going longer than 4 weeks he notices that pain started to return.  No new trauma or symptoms.   04/22/2021 Patient states that his hips have been hurting him the last couple of days, R shoulder has been giving him some problems 04/03/2021.    05/20/2021 Patient states that hes  doing good has some pain in his shoulders and hips    06/20/2021 Patient states that hes doing good   07/04/2021 Patient states that his back has been hurting and its radiating down to his hip and down his leg , no numbness tingling   5/11/023 Patient states he pulled a muscle in his back Tuesday had pain all down his leg hurts a little not as bad as it did on Tuesday    08/16/2021 Patient states that lower back and neck is tight    09/12/2021 Patient states that he is good just a tune up    10/21/2021 Patient states normal , thinks he did something to hip yesterday has pain from lateral hip to ankle   9/21/023 Patient states he is good left hip had been hurting a week ago but it hasn't hurt since    12/26/2021 Patient states that he is pretty good just needs a tune up    01/23/2022 Patient states back pain this week    02/20/2022 Patient states had some pain in his right hip and lower back   03/20/2022 Patient states he pretty good , shoulder pain and lower back    04/17/2022 Patient states hips have been in pain    05/28/2022 Patient states hips and back are flared been caring for 2 babies    06/25/2022 Patient states sharp pain in his right knee , hx of injury in high school, pain for  a week , he has been wearing a knee brace the knee brace does help , no MOI   07/16/2022 Patient states     Relevant Historical Information: None pertinen  Additional pertinent review of systems negative.  Current Outpatient Medications  Medication Sig Dispense Refill   albuterol (VENTOLIN HFA) 108 (90 Base) MCG/ACT inhaler Inhale 1-2 puffs into the lungs every 6 (six) hours as needed for wheezing or shortness of breath. 18 g 0   diclofenac (VOLTAREN) 75 MG EC tablet Take 1 tablet (75 mg total) by mouth 2 (two) times daily as needed. 30 tablet 5   hydrOXYzine (VISTARIL) 25 MG capsule Take 1 capsule (25 mg total) by mouth 2 (two) times daily as needed. 180 capsule 1   levocetirizine (XYZAL)  5 MG tablet Take 1 tablet (5 mg total) by mouth every evening. 90 tablet 1   meloxicam (MOBIC) 15 MG tablet Take 1 tablet (15 mg total) by mouth daily. 30 tablet 0   montelukast (SINGULAIR) 10 MG tablet Take 1 tablet (10 mg total) by mouth at bedtime. 90 tablet 1   ondansetron (ZOFRAN) 8 MG tablet Take 1 tablet (8 mg total) by mouth every 8 (eight) hours as needed for nausea or vomiting. 20 tablet 0   pantoprazole (PROTONIX) 20 MG tablet Take 1 tablet (20 mg total) by mouth daily as needed (Acid Reflux). 90 tablet 1   propranolol (INDERAL) 20 MG tablet Take 1 tablet (20 mg total) by mouth 2 (two) times daily. 180 tablet 1   QUEtiapine (SEROQUEL) 100 MG tablet Take 0.5 tablets (50 mg total) by mouth at bedtime. 45 tablet 1   tiZANidine (ZANAFLEX) 2 MG tablet Take 1 tablet (2 mg total) by mouth at bedtime. (Patient taking differently: Take 2 mg by mouth at bedtime as needed.) 30 tablet 0   venlafaxine XR (EFFEXOR XR) 75 MG 24 hr capsule Take 1 capsule (75 mg total) by mouth daily with breakfast. 90 capsule 1   No current facility-administered medications for this visit.      Objective:     There were no vitals filed for this visit.    There is no height or weight on file to calculate BMI.    Physical Exam:     General: Well-appearing, cooperative, sitting comfortably in no acute distress.   OMT Physical Exam:  ASIS Compression Test: Positive Right Cervical: TTP paraspinal, *** Rib: Bilateral elevated first rib with TTP Thoracic: TTP paraspinal,*** Lumbar: TTP paraspinal,*** Pelvis: Right anterior innominate  Electronically signed by:  [name] [name] 7:30 AM 07/15/22

## 2022-07-16 ENCOUNTER — Ambulatory Visit (INDEPENDENT_AMBULATORY_CARE_PROVIDER_SITE_OTHER): Payer: 59 | Admitting: Sports Medicine

## 2022-07-16 VITALS — HR 97 | Ht 70.0 in | Wt 210.0 lb

## 2022-07-16 DIAGNOSIS — M545 Low back pain, unspecified: Secondary | ICD-10-CM | POA: Diagnosis not present

## 2022-07-16 DIAGNOSIS — G8929 Other chronic pain: Secondary | ICD-10-CM | POA: Diagnosis not present

## 2022-07-16 DIAGNOSIS — M9905 Segmental and somatic dysfunction of pelvic region: Secondary | ICD-10-CM | POA: Diagnosis not present

## 2022-07-16 DIAGNOSIS — M9901 Segmental and somatic dysfunction of cervical region: Secondary | ICD-10-CM | POA: Diagnosis not present

## 2022-07-16 DIAGNOSIS — M9908 Segmental and somatic dysfunction of rib cage: Secondary | ICD-10-CM

## 2022-07-16 DIAGNOSIS — M9902 Segmental and somatic dysfunction of thoracic region: Secondary | ICD-10-CM | POA: Diagnosis not present

## 2022-07-16 DIAGNOSIS — M7651 Patellar tendinitis, right knee: Secondary | ICD-10-CM | POA: Diagnosis not present

## 2022-07-16 DIAGNOSIS — M9903 Segmental and somatic dysfunction of lumbar region: Secondary | ICD-10-CM

## 2022-07-16 NOTE — Patient Instructions (Signed)
Good to see you   

## 2022-08-06 NOTE — Progress Notes (Signed)
[name] D[name] [address] [place] [address] Phone: [phone]   Assessment and Plan:     1. Chronic bilateral low back pain without sciatica 2. Somatic dysfunction of cervical region 3. Somatic dysfunction of thoracic region 4. Somatic dysfunction of lumbar region 5. Somatic dysfunction of pelvic region 6. Somatic dysfunction of rib region  -Chronic, stable, subsequent visit - Overall musculoskeletal complaints have been well-controlled with regular OMT.  No particular flare at today's visit - Patient has received significant relief with OMT in the past.  Elects for repeat OMT today.  Tolerated well per note below. - Decision today to treat with OMT was based on Physical Exam   After verbal consent patient was treated with HVLA (high velocity low amplitude), ME (muscle energy), FPR (flex positional release), ST (soft tissue), PC/PD (Pelvic Compression/ Pelvic Decompression) techniques in cervical, rib, thoracic, lumbar, and pelvic areas. Patient tolerated the procedure well with improvement in symptoms.  Patient educated on potential side effects of soreness and recommended to rest, hydrate, and use Tylenol as needed for pain control.   Pertinent previous records reviewed include none   Follow Up: 4 weeks for reevaluation.  Could consider repeat OMT   Subjective:   I, [name], am serving as a Neurosurgeon for [name]  Chief Complaint: OMT follow up    HPI:  01/22/21 Patient is a 22 year old male presenting with neck and back pain. Patient last saw [name] on 11/15/2020 for this reason and had OMT. Today patient states that he is here for maintenance OMT.     02/19/2021 Patient states after the last OMT visit the following week the neck got tight after laying on a friends bed gave him a crick in his neck for the next two days , back and neck still feel tight    03/25/2021 Patient states that he's feeling pretty good  pain in his right shoulder blade.  States that he has felt good generally for about 4 weeks in between appointments, however going longer than 4 weeks he notices that pain started to return.  No new trauma or symptoms.   04/22/2021 Patient states that his hips have been hurting him the last couple of days, R shoulder has been giving him some problems 04/03/2021.    05/20/2021 Patient states that hes doing good has some pain in his shoulders and hips    06/20/2021 Patient states that hes doing good   07/04/2021 Patient states that his back has been hurting and its radiating down to his hip and down his leg , no numbness tingling   5/11/023 Patient states he pulled a muscle in his back Tuesday had pain all down his leg hurts a little not as bad as it did on Tuesday    08/16/2021 Patient states that lower back and neck is tight    09/12/2021 Patient states that he is good just a tune up    10/21/2021 Patient states normal , thinks he did something to hip yesterday has pain from lateral hip to ankle   9/21/023 Patient states he is good left hip had been hurting a week ago but it hasn't hurt since    12/26/2021 Patient states that he is pretty good just needs a tune up    01/23/2022 Patient states back pain this week    02/20/2022 Patient states had some pain in his right hip and lower back   03/20/2022 Patient states he  pretty good , shoulder pain and lower back    04/17/2022 Patient states hips have been in pain    05/28/2022 Patient states hips and back are flared been caring for 2 babies    06/25/2022 Patient states sharp pain in his right knee , hx of injury in high school, pain for a week , he has been wearing a knee brace the knee brace does help , no MOI   07/16/2022 Patient states that he is pretty good back and hip are in pain , knee is better    08/13/2022 Patient states grandma passed a couple of weeks ago, no pain just adjustment     Relevant Historical Information: None  pertinent  Additional pertinent review of systems negative.  Current Outpatient Medications  Medication Sig Dispense Refill   albuterol (VENTOLIN HFA) 108 (90 Base) MCG/ACT inhaler Inhale 1-2 puffs into the lungs every 6 (six) hours as needed for wheezing or shortness of breath. 18 g 0   diclofenac (VOLTAREN) 75 MG EC tablet Take 1 tablet (75 mg total) by mouth 2 (two) times daily as needed. 30 tablet 5   hydrOXYzine (VISTARIL) 25 MG capsule Take 1 capsule (25 mg total) by mouth 2 (two) times daily as needed. 180 capsule 1   levocetirizine (XYZAL) 5 MG tablet Take 1 tablet (5 mg total) by mouth every evening. 90 tablet 1   meloxicam (MOBIC) 15 MG tablet Take 1 tablet (15 mg total) by mouth daily. 30 tablet 0   montelukast (SINGULAIR) 10 MG tablet Take 1 tablet (10 mg total) by mouth at bedtime. 90 tablet 1   ondansetron (ZOFRAN) 8 MG tablet Take 1 tablet (8 mg total) by mouth every 8 (eight) hours as needed for nausea or vomiting. 20 tablet 0   pantoprazole (PROTONIX) 20 MG tablet Take 1 tablet (20 mg total) by mouth daily as needed (Acid Reflux). 90 tablet 1   propranolol (INDERAL) 20 MG tablet Take 1 tablet (20 mg total) by mouth 2 (two) times daily. 180 tablet 1   QUEtiapine (SEROQUEL) 100 MG tablet Take 0.5 tablets (50 mg total) by mouth at bedtime. 45 tablet 1   tiZANidine (ZANAFLEX) 2 MG tablet Take 1 tablet (2 mg total) by mouth at bedtime. (Patient taking differently: Take 2 mg by mouth at bedtime as needed.) 30 tablet 0   venlafaxine XR (EFFEXOR XR) 75 MG 24 hr capsule Take 1 capsule (75 mg total) by mouth daily with breakfast. 90 capsule 1   No current facility-administered medications for this visit.      Objective:     Vitals:   08/13/22 1349  Pulse: (!) 111  SpO2: 98%  Weight: 210 lb (95.3 kg)  Height: 5\' 10"  (1.778 m)      Body mass index is 30.13 kg/m.    Physical Exam:     General: Well-appearing, cooperative, sitting comfortably in no acute distress.   OMT  Physical Exam:  ASIS Compression Test: Positive left Cervical: TTP paraspinal, C3-5 RRSR Rib: Appropriately mobile first rib with NTTP Thoracic: TTP paraspinal, T5-7 RRSL Lumbar: TTP paraspinal, L1-3 RLSR, L5 RR Pelvis: Left anterior innominate  Electronically signed by:  [name] [name] 2:12 PM 08/13/22

## 2022-08-13 ENCOUNTER — Ambulatory Visit (INDEPENDENT_AMBULATORY_CARE_PROVIDER_SITE_OTHER): Payer: 59 | Admitting: Sports Medicine

## 2022-08-13 VITALS — HR 111 | Ht 70.0 in | Wt 210.0 lb

## 2022-08-13 DIAGNOSIS — M9902 Segmental and somatic dysfunction of thoracic region: Secondary | ICD-10-CM | POA: Diagnosis not present

## 2022-08-13 DIAGNOSIS — M545 Low back pain, unspecified: Secondary | ICD-10-CM

## 2022-08-13 DIAGNOSIS — M9903 Segmental and somatic dysfunction of lumbar region: Secondary | ICD-10-CM

## 2022-08-13 DIAGNOSIS — M9905 Segmental and somatic dysfunction of pelvic region: Secondary | ICD-10-CM

## 2022-08-13 DIAGNOSIS — G8929 Other chronic pain: Secondary | ICD-10-CM | POA: Diagnosis not present

## 2022-08-13 DIAGNOSIS — M9908 Segmental and somatic dysfunction of rib cage: Secondary | ICD-10-CM

## 2022-08-13 DIAGNOSIS — M9901 Segmental and somatic dysfunction of cervical region: Secondary | ICD-10-CM | POA: Diagnosis not present

## 2022-09-05 ENCOUNTER — Encounter: Payer: Self-pay | Admitting: Family Medicine

## 2022-09-05 ENCOUNTER — Ambulatory Visit (INDEPENDENT_AMBULATORY_CARE_PROVIDER_SITE_OTHER): Payer: 59 | Admitting: Family Medicine

## 2022-09-05 VITALS — BP 122/79 | HR 79 | Temp 98.1°F | Wt 206.2 lb

## 2022-09-05 DIAGNOSIS — G43719 Chronic migraine without aura, intractable, without status migrainosus: Secondary | ICD-10-CM | POA: Diagnosis not present

## 2022-09-05 DIAGNOSIS — F418 Other specified anxiety disorders: Secondary | ICD-10-CM | POA: Diagnosis not present

## 2022-09-05 DIAGNOSIS — M545 Low back pain, unspecified: Secondary | ICD-10-CM | POA: Diagnosis not present

## 2022-09-05 DIAGNOSIS — K219 Gastro-esophageal reflux disease without esophagitis: Secondary | ICD-10-CM | POA: Diagnosis not present

## 2022-09-05 MED ORDER — LEVOCETIRIZINE DIHYDROCHLORIDE 5 MG PO TABS: 5.0000 mg | ORAL_TABLET | Freq: Every evening | ORAL | 1 refills | Status: AC

## 2022-09-05 MED ORDER — VENLAFAXINE HCL ER 75 MG PO CP24: 75.0000 mg | ORAL_CAPSULE | Freq: Every day | ORAL | 1 refills | Status: DC

## 2022-09-05 MED ORDER — PROPRANOLOL HCL 20 MG PO TABS: 20.0000 mg | ORAL_TABLET | Freq: Two times a day (BID) | ORAL | 1 refills | Status: DC

## 2022-09-05 MED ORDER — VENLAFAXINE HCL ER 37.5 MG PO CP24: 37.5000 mg | ORAL_CAPSULE | Freq: Every day | ORAL | 0 refills | Status: DC

## 2022-09-05 MED ORDER — HYDROXYZINE PAMOATE 25 MG PO CAPS: 25.0000 mg | ORAL_CAPSULE | Freq: Two times a day (BID) | ORAL | 1 refills | Status: DC | PRN

## 2022-09-05 MED ORDER — PANTOPRAZOLE SODIUM 20 MG PO TBEC: 20.0000 mg | DELAYED_RELEASE_TABLET | Freq: Every day | ORAL | 1 refills | Status: DC | PRN

## 2022-09-05 MED ORDER — MONTELUKAST SODIUM 10 MG PO TABS: 10.0000 mg | ORAL_TABLET | Freq: Every day | ORAL | 1 refills | Status: DC

## 2022-09-05 NOTE — Progress Notes (Signed)
Patient ID: Stephen Sexton, male  DOB: Jun 22, 2000, 22 y.o.   MRN: [id number] Patient Care Team    Relationship Specialty Notifications Start End  Natalia Leatherwood, DO PCP - General Family Medicine  06/25/22     Chief Complaint  Patient presents with   Depression    Would like to restart all medications    Subjective: Stephen Sexton is a 22 y.o.  male present for [facility] Depression/anxiety/insomnia/ADHD: pt reports ffexor 75, vistartil prn (rarely needs now) had helped a great deal prior. He was taking care of his grandma, that has now passed, and stopped taking of himself . Last seen 2022. He would like to restart medication for his depression and anxiety..  Prior note Patient reports he is prescribed trazodone 50 mg nightly for his insomnia.  He does feel this dose causes him to be sleepy in the morning.  He has not tried a lower dose in the past.  He has had a diagnosis of ADHD since he was a child.  He was tried on many different medications the last notable medication on record was Focalin.  He states he has not taken this medication in almost a year.  He reports he has become better adjusted to his ADHD.  Migraine, chronic: Patient reports propranolol 20 mg twice daily had wprked well for migraines.  He has not been tried on other medications- regimen present since 2016ish.   GERD: reflux symptoms returned. He would like to restart protonix. Prior note: Patient reports he has had a history of cyclic vomiting.  At one time he had a great deal difficulty getting enough nutrients secondary to cyclic vomiting.  He has been tried on Zofran, Phenergan and propranolol for his cyclic vomiting.  Allergies: Patient reports he has been prescribed Singulair and levocetirizine for his allergies. Allergies are were well controlled. He wpuld like to restart     09/05/2022    8:52 AM 01/08/2021    8:33 AM 10/08/2020    8:18 AM 04/26/2020    9:06 AM 10/21/2019    1:39 PM  Depression  screen PHQ 2/9  Decreased Interest 2 1 2 1 2   Down, Depressed, Hopeless 2 1 3 1 1   PHQ - 2 Score 4 2 5 2 3   Altered sleeping 1 0 3 2 3   Tired, decreased energy 3 0 3 0 3  Change in appetite 2 1 1  0 2  Feeling bad or failure about yourself  2 0 2 1 1   Trouble concentrating 3 0 0 0 1  Moving slowly or fidgety/restless 1 0 0 0 0  Suicidal thoughts 0 0 1 0 0  PHQ-9 Score 16 3 15 5 13   Difficult doing work/chores Somewhat difficult    Somewhat difficult      09/05/2022    8:52 AM 01/08/2021    8:33 AM 10/08/2020    8:21 AM 04/26/2020    9:06 AM  GAD 7 : Generalized Anxiety Score  Nervous, Anxious, on Edge 3 1 1 1   Control/stop worrying 3 0 1 1  Worry too much - different things 3 1 1 1   Trouble relaxing 2 0 1 0  Restless 1 0 1 1  Easily annoyed or irritable 2 1 1 1   Afraid - awful might happen 2 0 1 0  Total GAD 7 Score 16 3 7 5   Anxiety Difficulty Very difficult          09/05/2022    8:52  AM 01/08/2021    8:33 AM 10/08/2020    8:21 AM 04/26/2020    9:06 AM  GAD 7 : Generalized Anxiety Score  Nervous, Anxious, on Edge 3 1 1 1   Control/stop worrying 3 0 1 1  Worry too much - different things 3 1 1 1   Trouble relaxing 2 0 1 0  Restless 1 0 1 1  Easily annoyed or irritable 2 1 1 1   Afraid - awful might happen 2 0 1 0  Total GAD 7 Score 16 3 7 5   Anxiety Difficulty Very difficult              09/05/2022    8:51 AM  Fall Risk   Falls in the past year? 0  Number falls in past yr: 0  Injury with Fall? 0  Risk for fall due to : No Fall Risks  Follow up Falls evaluation completed    Immunization History  Administered Date(s) Administered   DTaP 06/14/2001, 08/30/2001, 10/04/2001, 12/06/2002, 04/04/2005   HIB (PRP-T) 06/14/2001, 08/30/2001, 10/04/2001, 08/17/2002   HPV 9-valent 03/22/2015, 09/13/2015   Hepatitis A, Ped/Adol-2 Dose 06/18/2006, 04/12/2010   Hepatitis B, PED/ADOLESCENT 01/09/2002, 05/12/2001, 12/30/2001   IPV 06/14/2001, 08/30/2001, 04/28/2002, 04/04/2005    Influenza Split 03/15/2009, 04/18/2009, 03/21/2010   Influenza, Seasonal, Injecte, Preservative Fre 02/21/2011   Influenza,inj,Quad PF,6+ Mos 03/22/2015, 12/13/2015, 11/25/2019, 01/08/2021   Influenza-Unspecified 11/12/2016   MMR 08/17/2002   MMRV 04/04/2005   Meningococcal Conjugate 05/06/2012   PFIZER(Purple Top)SARS-COV-2 Vaccination 06/30/2019, 07/20/2019   Pneumococcal Conjugate-13 06/14/2001, 08/30/2001, 10/04/2001, 04/28/2002   Tdap 05/06/2012   Varicella 04/28/2002    No results found.  Past Medical History:  Diagnosis Date   Abdominal migraine 09/08/2017   Acid reflux    ADHD    Anxiety    Cyclic vomiting syndrome    Depression    Dysphonia 04/14/2017   Environmental and seasonal allergies    Tension headache 12/26/2016   Allergies  Allergen Reactions   Amitriptyline     Anger   Past Surgical History:  Procedure Laterality Date   NO PAST SURGERIES     Family History  Problem Relation Age of Onset   Arthritis Mother    Diabetes Mother    Hypertension Mother    Hyperlipidemia Mother    Arthritis Father    Drug abuse Brother    Arthritis Maternal Grandmother    Cancer Maternal Grandmother    Depression Maternal Grandmother    Diabetes Maternal Grandmother    Hyperlipidemia Maternal Grandmother    Hypertension Maternal Grandmother    Miscarriages / Stillbirths Maternal Grandmother    Arthritis Maternal Grandfather    Cancer Maternal Grandfather    Diabetes Maternal Grandfather    Heart attack Maternal Grandfather    Heart disease Maternal Grandfather    Hyperlipidemia Maternal Grandfather    Hypertension Maternal Grandfather    Miscarriages / Stillbirths Paternal Grandmother    Stroke Paternal Grandmother    Hypertension Paternal Grandmother    Diabetes Paternal Grandmother    Cancer Paternal Grandmother    Cancer Paternal Grandfather    Diabetes Paternal Grandfather    Social History   Social History Narrative   Marital status/children/pets:  Single.  Lives with mother and grandparent.   Education/employment: 12th grade graduate> attending college   Safety:      -Wears a bicycle helmet riding a bike: Yes     -smoke alarm in the home:Yes     - wears seatbelt: Yes     -  Feels safe in their relationships: Yes    Allergies as of 09/05/2022       Reactions   Amitriptyline    Anger        Medication List        Accurate as of September 05, 2022  9:14 AM. If you have any questions, ask your nurse or doctor.          STOP taking these medications    albuterol 108 (90 Base) MCG/ACT inhaler Commonly known as: VENTOLIN HFA   diclofenac 75 MG EC tablet Commonly known as: VOLTAREN   meloxicam 15 MG tablet Commonly known as: MOBIC   ondansetron 8 MG tablet Commonly known as: Zofran   QUEtiapine 100 MG tablet Commonly known as: SEROquel   tiZANidine 2 MG tablet Commonly known as: ZANAFLEX       TAKE these medications    hydrOXYzine 25 MG capsule Commonly known as: Vistaril Take 1 capsule (25 mg total) by mouth 2 (two) times daily as needed.   levocetirizine 5 MG tablet Commonly known as: XYZAL Take 1 tablet (5 mg total) by mouth every evening.   montelukast 10 MG tablet Commonly known as: SINGULAIR Take 1 tablet (10 mg total) by mouth at bedtime.   pantoprazole 20 MG tablet Commonly known as: PROTONIX Take 1 tablet (20 mg total) by mouth daily as needed (Acid Reflux).   propranolol 20 MG tablet Commonly known as: INDERAL Take 1 tablet (20 mg total) by mouth 2 (two) times daily.   venlafaxine XR 37.5 MG 24 hr capsule Commonly known as: Effexor XR Take 1 capsule (37.5 mg total) by mouth daily with breakfast. What changed: You were already taking a medication with the same name, and this prescription was added. Make sure you understand how and when to take each.   venlafaxine XR 75 MG 24 hr capsule Commonly known as: Effexor XR Take 1 capsule (75 mg total) by mouth daily with breakfast. Start  taking on: September 11, 2022 What changed: These instructions start on September 11, 2022. If you are unsure what to do until then, ask your doctor or other care provider.        All past medical history, surgical history, allergies, family history, immunizations andmedications were updated in the EMR today and reviewed under the history and medication portions of their EMR.      ROS: 14 pt review of systems performed and negative (unless mentioned in an HPI)  Objective: BP 122/79   Pulse 79   Temp 98.1 F (36.7 C)   Wt 206 lb 3.2 oz (93.5 kg)   SpO2 98%   BMI 29.59 kg/m  Physical Exam Vitals and nursing note reviewed.  Constitutional:      General: He is not in acute distress.    Appearance: Normal appearance. He is not ill-appearing, toxic-appearing or diaphoretic.  HENT:     Head: Normocephalic and atraumatic.  Eyes:     General: No scleral icterus.       Right eye: No discharge.        Left eye: No discharge.     Extraocular Movements: Extraocular movements intact.     Pupils: Pupils are equal, round, and reactive to light.  Cardiovascular:     Rate and Rhythm: Normal rate and regular rhythm.  Pulmonary:     Effort: Pulmonary effort is normal. No respiratory distress.     Breath sounds: Normal breath sounds. No wheezing, rhonchi or rales.  Musculoskeletal:  Right lower leg: No edema.     Left lower leg: No edema.  Skin:    General: Skin is warm.     Findings: No rash.  Neurological:     Mental Status: He is alert and oriented to person, place, and time. Mental status is at baseline.  Psychiatric:        Mood and Affect: Mood normal.        Behavior: Behavior normal.        Thought Content: Thought content normal.        Judgment: Judgment normal.      Assessment/plan: Stephen Sexton is a 22 y.o. male present for  Chronic migraine Restart  propranolol 20 mg BID  GERD/cyclic vomiting Symptoms returned- need to restart med restart Protonix 20  QD continue senakot nightly prn. Goal 3 BM a week.  Consider Remeron, Topamax, B6 if worsening.  Reported allergy to amitriptyline  Depression with anxiety Restart effexor taper to 75 mg qd   Restart Vistaril 25 mg BID PRN  Allergic rhinitis due to pollen, unspecified seasonality Restart  Xyzal restart Singulair   Return in about 5 weeks (around 10/10/2022) for cpe (20 min), Routine chronic condition follow-up.  No orders of the defined types were placed in this encounter.   Meds ordered this encounter  Medications   hydrOXYzine (VISTARIL) 25 MG capsule    Sig: Take 1 capsule (25 mg total) by mouth 2 (two) times daily as needed.    Dispense:  180 capsule    Refill:  1   levocetirizine (XYZAL) 5 MG tablet    Sig: Take 1 tablet (5 mg total) by mouth every evening.    Dispense:  90 tablet    Refill:  1   montelukast (SINGULAIR) 10 MG tablet    Sig: Take 1 tablet (10 mg total) by mouth at bedtime.    Dispense:  90 tablet    Refill:  1   propranolol (INDERAL) 20 MG tablet    Sig: Take 1 tablet (20 mg total) by mouth 2 (two) times daily.    Dispense:  180 tablet    Refill:  1   venlafaxine XR (EFFEXOR XR) 75 MG 24 hr capsule    Sig: Take 1 capsule (75 mg total) by mouth daily with breakfast.    Dispense:  90 capsule    Refill:  1   pantoprazole (PROTONIX) 20 MG tablet    Sig: Take 1 tablet (20 mg total) by mouth daily as needed (Acid Reflux).    Dispense:  90 tablet    Refill:  1   venlafaxine XR (EFFEXOR XR) 37.5 MG 24 hr capsule    Sig: Take 1 capsule (37.5 mg total) by mouth daily with breakfast.    Dispense:  7 capsule    Refill:  0    Taper script #1     Referral Orders  No referral(s) requested today    Note is dictated utilizing voice recognition software. Although note has been proof read prior to signing, occasional typographical errors still can be missed. If any questions arise, please do not hesitate to call for verification.  Electronically signed  by: Felix Pacini, DO [facility] [place]

## 2022-09-05 NOTE — Patient Instructions (Signed)
No follow-ups on file.        Great to see you today.  I have refilled the medication(s) we provide.   If labs were collected, we will inform you of lab results once received either by echart message or telephone call.   - echart message- for normal results that have been seen by the patient already.   - telephone call: abnormal results or if patient has not viewed results in their echart.  

## 2022-09-10 ENCOUNTER — Ambulatory Visit: Payer: 59 | Admitting: Sports Medicine

## 2022-10-08 ENCOUNTER — Encounter (INDEPENDENT_AMBULATORY_CARE_PROVIDER_SITE_OTHER): Payer: Self-pay

## 2022-10-21 ENCOUNTER — Ambulatory Visit (INDEPENDENT_AMBULATORY_CARE_PROVIDER_SITE_OTHER): Payer: 59 | Admitting: Family Medicine

## 2022-10-21 ENCOUNTER — Encounter: Payer: Self-pay | Admitting: Family Medicine

## 2022-10-21 VITALS — BP 118/74 | HR 77 | Temp 97.9°F | Ht 71.5 in | Wt 196.4 lb

## 2022-10-21 DIAGNOSIS — Z0000 Encounter for general adult medical examination without abnormal findings: Secondary | ICD-10-CM | POA: Diagnosis not present

## 2022-10-21 DIAGNOSIS — Z118 Encounter for screening for other infectious and parasitic diseases: Secondary | ICD-10-CM

## 2022-10-21 DIAGNOSIS — Z114 Encounter for screening for human immunodeficiency virus [HIV]: Secondary | ICD-10-CM

## 2022-10-21 DIAGNOSIS — Z23 Encounter for immunization: Secondary | ICD-10-CM | POA: Diagnosis not present

## 2022-10-21 DIAGNOSIS — F418 Other specified anxiety disorders: Secondary | ICD-10-CM

## 2022-10-21 DIAGNOSIS — Z823 Family history of stroke: Secondary | ICD-10-CM

## 2022-10-21 DIAGNOSIS — Z8249 Family history of ischemic heart disease and other diseases of the circulatory system: Secondary | ICD-10-CM

## 2022-10-21 DIAGNOSIS — E663 Overweight: Secondary | ICD-10-CM

## 2022-10-21 DIAGNOSIS — Z13220 Encounter for screening for lipoid disorders: Secondary | ICD-10-CM | POA: Diagnosis not present

## 2022-10-21 DIAGNOSIS — Z1159 Encounter for screening for other viral diseases: Secondary | ICD-10-CM

## 2022-10-21 LAB — COMPREHENSIVE METABOLIC PANEL
ALT: 43 U/L (ref 0–53)
AST: 27 U/L (ref 0–37)
Albumin: 4.6 g/dL (ref 3.5–5.2)
Alkaline Phosphatase: 108 U/L (ref 39–117)
BUN: 11 mg/dL (ref 6–23)
CO2: 28 mEq/L (ref 19–32)
Calcium: 9.4 mg/dL (ref 8.4–10.5)
Chloride: 102 mEq/L (ref 96–112)
Creatinine, Ser: 1.07 mg/dL (ref 0.40–1.50)
GFR: 99.17 mL/min (ref >60.00)
Glucose, Bld: 90 mg/dL (ref 70–99)
Potassium: 4.1 mEq/L (ref 3.5–5.1)
Sodium: 139 mEq/L (ref 135–145)
Total Bilirubin: 0.3 mg/dL (ref 0.2–1.2)
Total Protein: 7.5 g/dL (ref 6.0–8.3)

## 2022-10-21 LAB — CBC WITH DIFFERENTIAL/PLATELET
Basophils Absolute: 0.1 10*3/uL (ref 0.0–0.1)
Basophils Relative: 0.8 % (ref 0.0–3.0)
Eosinophils Absolute: 0.3 10*3/uL (ref 0.0–0.7)
Eosinophils Relative: 3.6 % (ref 0.0–5.0)
HCT: 45.6 % (ref 39.0–52.0)
Hemoglobin: 15.2 g/dL (ref 13.0–17.0)
Lymphocytes Relative: 33.6 % (ref 12.0–46.0)
Lymphs Abs: 2.9 10*3/uL (ref 0.7–4.0)
MCHC: 33.3 g/dL (ref 30.0–36.0)
MCV: 92 fl (ref 78.0–100.0)
Monocytes Absolute: 0.7 10*3/uL (ref 0.1–1.0)
Monocytes Relative: 8.6 % (ref 3.0–12.0)
Neutro Abs: 4.6 10*3/uL (ref 1.4–7.7)
Neutrophils Relative %: 53.4 % (ref 43.0–77.0)
Platelets: 267 10*3/uL (ref 150.0–400.0)
RBC: 4.95 Mil/uL (ref 4.22–5.81)
RDW: 12.8 % (ref 11.5–15.5)
WBC: 8.7 10*3/uL (ref 4.0–10.5)

## 2022-10-21 LAB — LIPID PANEL
Cholesterol: 156 mg/dL (ref 0–200)
HDL: 43.8 mg/dL (ref >39.00)
LDL Cholesterol: 95 mg/dL (ref 0–99)
NonHDL: 112.25
Total CHOL/HDL Ratio: 4
Triglycerides: 88 mg/dL (ref 0.0–149.0)
VLDL: 17.6 mg/dL (ref 0.0–40.0)

## 2022-10-21 LAB — TSH: TSH: 3.42 u[IU]/mL (ref 0.35–5.50)

## 2022-10-21 NOTE — Progress Notes (Signed)
Patient ID: Stephen Sexton, male  DOB: 2000-11-13, 22 y.o.   MRN: [id number] Patient Care Team    Relationship Specialty Notifications Start End  Natalia Leatherwood, DO PCP - General Family Medicine  06/25/22     Chief Complaint  Patient presents with   Well Child   Annual Exam    Pt is fasting    Subjective:  Stephen Sexton is a 22 y.o. male present for CPE. All past medical history, surgical history, allergies, family history, immunizations, medications and social history were updated in the electronic medical record today. All recent labs, ED visits and hospitalizations within the last year were reviewed.  Health maintenance:  Colon cancer screen:no  fhx. Screen at 45 Immunizations:  tdap due today - adminstered, influenza - encouraged yearly during season Infectious disease screening: HIV , Hep C , chlamydia screen > never SA- not indicated PSA: No results found for: "PSA", pt was counseled on prostate cancer screenings. No fhx known. Screen at 50 Patient has a Dental home. Hospitalizations/ED visits: reviewed     10/21/2022    8:14 AM 09/05/2022    8:52 AM 01/08/2021    8:33 AM 10/08/2020    8:18 AM 04/26/2020    9:06 AM  Depression screen PHQ 2/9  Decreased Interest 1 2 1 2 1   Down, Depressed, Hopeless 1 2 1 3 1   PHQ - 2 Score 2 4 2 5 2   Altered sleeping 2 1 0 3 2  Tired, decreased energy 1 3 0 3 0  Change in appetite 1 2 1 1  0  Feeling bad or failure about yourself  1 2 0 2 1  Trouble concentrating 0 3 0 0 0  Moving slowly or fidgety/restless 0 1 0 0 0  Suicidal thoughts 0 0 0 1 0  PHQ-9 Score 7 16 3 15 5   Difficult doing work/chores Somewhat difficult Somewhat difficult         10/21/2022    8:15 AM 09/05/2022    8:52 AM 01/08/2021    8:33 AM 10/08/2020    8:21 AM  GAD 7 : Generalized Anxiety Score  Nervous, Anxious, on Edge 1 3 1 1   Control/stop worrying 2 3 0 1  Worry too much - different things 1 3 1 1   Trouble relaxing 1 2 0 1  Restless 1  1 0 1  Easily annoyed or irritable 2 2 1 1   Afraid - awful might happen 1 2 0 1  Total GAD 7 Score 9 16 3 7   Anxiety Difficulty Somewhat difficult Very difficult             10/21/2022    8:14 AM 09/05/2022    8:51 AM  Fall Risk   Falls in the past year? 0 0  Number falls in past yr: 0 0  Injury with Fall? 0 0  Risk for fall due to : No Fall Risks No Fall Risks  Follow up Falls evaluation completed Falls evaluation completed    Immunization History  Administered Date(s) Administered   DTaP 06/14/2001, 08/30/2001, 10/04/2001, 12/06/2002, 04/04/2005   HIB (PRP-T) 06/14/2001, 08/30/2001, 10/04/2001, 08/17/2002   HPV 9-valent 03/22/2015, 09/13/2015   Hepatitis A, Ped/Adol-2 Dose 06/18/2006, 04/12/2010   Hepatitis B, PED/ADOLESCENT 02/12/2002, 05/12/2001, 12/30/2001   IPV 06/14/2001, 08/30/2001, 04/28/2002, 04/04/2005   Influenza Split 03/15/2009, 04/18/2009, 03/21/2010   Influenza, Seasonal, Injecte, Preservative Fre 02/21/2011   Influenza,inj,Quad PF,6+ Mos 03/22/2015, 12/13/2015, 11/25/2019, 01/08/2021   Influenza-Unspecified 11/12/2016  MMR 08/17/2002   MMRV 04/04/2005   Meningococcal Conjugate 05/06/2012   PFIZER(Purple Top)SARS-COV-2 Vaccination 06/30/2019, 07/20/2019   Pneumococcal Conjugate-13 06/14/2001, 08/30/2001, 10/04/2001, 04/28/2002   Tdap 05/06/2012, 10/21/2022   Varicella 04/28/2002     Past Medical History:  Diagnosis Date   Abdominal migraine 09/08/2017   Acid reflux    ADHD    Anxiety    Cyclic vomiting syndrome    Depression    Dysphonia 04/14/2017   Environmental and seasonal allergies    Tension headache 12/26/2016   Allergies  Allergen Reactions   Amitriptyline     Anger   Past Surgical History:  Procedure Laterality Date   NO PAST SURGERIES     Family History  Problem Relation Age of Onset   Arthritis Mother    Diabetes Mother    Hypertension Mother    Hyperlipidemia Mother    Arthritis Father    Drug abuse Brother    Arthritis  Maternal Grandmother    Cancer Maternal Grandmother    Depression Maternal Grandmother    Diabetes Maternal Grandmother    Hyperlipidemia Maternal Grandmother    Hypertension Maternal Grandmother    Miscarriages / Stillbirths Maternal Grandmother    Arthritis Maternal Grandfather    Cancer Maternal Grandfather    Diabetes Maternal Grandfather    Heart attack Maternal Grandfather    Heart disease Maternal Grandfather    Hyperlipidemia Maternal Grandfather    Hypertension Maternal Grandfather    Miscarriages / Stillbirths Paternal Grandmother    Stroke Paternal Grandmother    Hypertension Paternal Grandmother    Diabetes Paternal Grandmother    Cancer Paternal Grandmother    Cancer Paternal Grandfather    Diabetes Paternal Grandfather    Social History   Social History Narrative   Marital status/children/pets: Single.  Lives with mother and grandparent.   Education/employment: 12th grade graduate> attending college   Safety:      -Wears a bicycle helmet riding a bike: Yes     -smoke alarm in the home:Yes     - wears seatbelt: Yes     - Feels safe in their relationships: Yes    Allergies as of 10/21/2022       Reactions   Amitriptyline    Anger        Medication List        Accurate as of October 21, 2022  8:26 AM. If you have any questions, ask your nurse or doctor.          hydrOXYzine 25 MG capsule Commonly known as: Vistaril Take 1 capsule (25 mg total) by mouth 2 (two) times daily as needed.   levocetirizine 5 MG tablet Commonly known as: XYZAL Take 1 tablet (5 mg total) by mouth every evening.   montelukast 10 MG tablet Commonly known as: SINGULAIR Take 1 tablet (10 mg total) by mouth at bedtime.   pantoprazole 20 MG tablet Commonly known as: PROTONIX Take 1 tablet (20 mg total) by mouth daily as needed (Acid Reflux).   propranolol 20 MG tablet Commonly known as: INDERAL Take 1 tablet (20 mg total) by mouth 2 (two) times daily.   venlafaxine  XR 75 MG 24 hr capsule Commonly known as: Effexor XR Take 1 capsule (75 mg total) by mouth daily with breakfast. What changed: Another medication with the same name was removed. Continue taking this medication, and follow the directions you see here. Changed by: Felix Pacini       All past medical history, surgical history,  allergies, family history, immunizations andmedications were updated in the EMR today and reviewed under the history and medication portions of their EMR.     No results found for this or any previous visit (from the past 2160 hour(s)).  DG Chest 2 View Result Date: 07/11/2022 CLINICAL DATA:  Cough and shortness of breath for 5 days, chest congestion, new onset fever EXAM: CHEST - 2 VIEW COMPARISON:  04/30/2017 FINDINGS: Normal heart size, mediastinal contours, and pulmonary vascularity. Chronic peribronchial thickening. No pulmonary infiltrate, pleural effusion, or pneumothorax. Osseous structures unremarkable. IMPRESSION: Chronic bronchitic changes without acute abnormalities. Electronically Signed   By: Ulyses Southward M.D.   On: 07/11/2022 10:15   ROS 14 pt review of systems performed and negative (unless mentioned in an HPI)  Objective: BP 118/74   Pulse 77   Temp 97.9 F (36.6 C)   Ht 5' 11.5" (1.816 m)   Wt 196 lb 6.4 oz (89.1 kg)   SpO2 100%   BMI 27.01 kg/m  Physical Exam Constitutional:      General: He is not in acute distress.    Appearance: Normal appearance. He is not ill-appearing, toxic-appearing or diaphoretic.  HENT:     Head: Normocephalic and atraumatic.     Right Ear: Tympanic membrane, ear canal and external ear normal. There is no impacted cerumen.     Left Ear: Tympanic membrane, ear canal and external ear normal. There is no impacted cerumen.     Nose: Nose normal. No congestion or rhinorrhea.     Mouth/Throat:     Mouth: Mucous membranes are moist.     Pharynx: Oropharynx is clear. No oropharyngeal exudate or posterior oropharyngeal  erythema.  Eyes:     General: No scleral icterus.       Right eye: No discharge.        Left eye: No discharge.     Extraocular Movements: Extraocular movements intact.     Pupils: Pupils are equal, round, and reactive to light.  Cardiovascular:     Rate and Rhythm: Normal rate and regular rhythm.     Pulses: Normal pulses.     Heart sounds: Normal heart sounds. No murmur heard.    No friction rub. No gallop.  Pulmonary:     Effort: Pulmonary effort is normal. No respiratory distress.     Breath sounds: Normal breath sounds. No stridor. No wheezing, rhonchi or rales.  Chest:     Chest wall: No tenderness.  Abdominal:     General: Abdomen is flat. Bowel sounds are normal. There is no distension.     Palpations: Abdomen is soft. There is no mass.     Tenderness: There is no abdominal tenderness. There is no right CVA tenderness, left CVA tenderness, guarding or rebound.     Hernia: No hernia is present.  Musculoskeletal:        General: No swelling or tenderness. Normal range of motion.     Cervical back: Normal range of motion and neck supple.     Right lower leg: No edema.     Left lower leg: No edema.  Lymphadenopathy:     Cervical: No cervical adenopathy.  Skin:    General: Skin is warm and dry.     Coloration: Skin is not jaundiced.     Findings: No bruising, lesion or rash.  Neurological:     General: No focal deficit present.     Mental Status: He is alert and oriented to person, place, and time.  Mental status is at baseline.     Cranial Nerves: No cranial nerve deficit.     Sensory: No sensory deficit.     Motor: No weakness.     Coordination: Coordination normal.     Gait: Gait normal.     Deep Tendon Reflexes: Reflexes normal.  Psychiatric:        Mood and Affect: Mood normal.        Behavior: Behavior normal.        Thought Content: Thought content normal.        Judgment: Judgment normal.     No results found.  Assessment/plan: Stephen Sexton is  a 22 y.o. male present for CPE Need for Tdap vaccination - Tdap vaccine greater than or equal to 7yo IM Lipid screening/Overweight (BMI 25.0-29.9)/ FH: heart disease/ FH: stroke - Lipid panel Routine general medical examination at a health care facility - CBC with Differential/Platelet - Comprehensive metabolic panel - TSH encouraged to exercise greater than 150 minutes a week. Patient was encouraged to choose a diet filled with fresh fruits and vegetables, and lean meats. AVS provided to patient today for education/recommendation on gender specific health and safety maintenance. Colon cancer screen:no  fhx. Screen at 45 Immunizations:  tdap due today - adminstered, influenza - encouraged yearly during season Infectious disease screening: HIV , Hep C , chlamydia screen > never SA- not indicated  Return in about 22 weeks (around 03/24/2023) for Routine chronic condition follow-up.  Orders Placed This Encounter  Procedures   Tdap vaccine greater than or equal to 7yo IM   CBC with Differential/Platelet   Comprehensive metabolic panel   TSH   Lipid panel   No orders of the defined types were placed in this encounter.  Referral Orders  No referral(s) requested today     Note is dictated utilizing voice recognition software. Although note has been proof read prior to signing, occasional typographical errors still can be missed. If any questions arise, please do not hesitate to call for verification.  Electronically signed by: Felix Pacini, DO [facility] [place]

## 2022-10-21 NOTE — Patient Instructions (Addendum)

## 2022-12-11 ENCOUNTER — Ambulatory Visit: Payer: 59 | Admitting: Sports Medicine

## 2022-12-11 VITALS — HR 77 | Ht 71.0 in | Wt 190.0 lb

## 2022-12-11 DIAGNOSIS — M9902 Segmental and somatic dysfunction of thoracic region: Secondary | ICD-10-CM

## 2022-12-11 DIAGNOSIS — M9901 Segmental and somatic dysfunction of cervical region: Secondary | ICD-10-CM | POA: Diagnosis not present

## 2022-12-11 DIAGNOSIS — G8929 Other chronic pain: Secondary | ICD-10-CM | POA: Diagnosis not present

## 2022-12-11 DIAGNOSIS — M9903 Segmental and somatic dysfunction of lumbar region: Secondary | ICD-10-CM

## 2022-12-11 DIAGNOSIS — M9908 Segmental and somatic dysfunction of rib cage: Secondary | ICD-10-CM

## 2022-12-11 DIAGNOSIS — M9905 Segmental and somatic dysfunction of pelvic region: Secondary | ICD-10-CM

## 2022-12-11 DIAGNOSIS — M545 Low back pain, unspecified: Secondary | ICD-10-CM

## 2022-12-11 MED ORDER — MELOXICAM 15 MG PO TABS: 15.0000 mg | ORAL_TABLET | Freq: Every day | ORAL | 0 refills | Status: DC

## 2022-12-11 NOTE — Patient Instructions (Signed)
-   Start meloxicam 15 mg daily x2 weeks.  If still having pain after 2 weeks, complete 3rd-week of meloxicam. May use remaining meloxicam as needed once daily for pain control.  Do not to use additional NSAIDs while taking meloxicam.  May use Tylenol [phone] mg 2 to 3 times a day for breakthrough pain. 4 week follow up

## 2022-12-11 NOTE — Progress Notes (Signed)
Stephen Sexton D.[facility] [address] [place] [address] Phone: [phone]   Assessment and Plan:     1. Chronic bilateral low back pain without sciatica 2. Somatic dysfunction of cervical region 3. Somatic dysfunction of thoracic region 4. Somatic dysfunction of lumbar region 5. Somatic dysfunction of pelvic region 6. Somatic dysfunction of rib region  -Chronic with exacerbation, subsequent sports medicine visit - Recurrent musculoskeletal complaints with most prominent being in low back with patient starting a more physical new job as well as sleeping on a couch in between moving - Start meloxicam 15 mg daily x2 weeks.  If still having pain after 2 weeks, complete 3rd-week of meloxicam. May use remaining meloxicam as needed once daily for pain control.  Do not to use additional NSAIDs while taking meloxicam.  May use Tylenol [phone] mg 2 to 3 times a day for breakthrough pain. - Patient has received relief with OMT in the past.  Elects for repeat OMT today.  Tolerated well per note below. - Decision today to treat with OMT was based on Physical Exam   After verbal consent patient was treated with HVLA (high velocity low amplitude), ME (muscle energy), FPR (flex positional release), ST (soft tissue), PC/PD (Pelvic Compression/ Pelvic Decompression) techniques in cervical, rib, thoracic, lumbar, and pelvic areas. Patient tolerated the procedure well with improvement in symptoms.  Patient educated on potential side effects of soreness and recommended to rest, hydrate, and use Tylenol as needed for pain control.   Pertinent previous records reviewed include none   Follow Up: 4 weeks for reevaluation.  Could consider repeat OMT   Subjective:   I, Stephen Sexton, am serving as a Neurosurgeon for [name]  Chief Complaint: OMT follow up    HPI:  01/22/21 Patient is a 22 year old male presenting with neck and back pain. Patient last saw [name]  on 11/15/2020 for this reason and had OMT. Today patient states that he is here for maintenance OMT.     02/19/2021 Patient states after the last OMT visit the following week the neck got tight after laying on a friends bed gave him a crick in his neck for the next two days , back and neck still feel tight    03/25/2021 Patient states that he's feeling pretty good pain in his right shoulder blade.  States that he has felt good generally for about 4 weeks in between appointments, however going longer than 4 weeks he notices that pain started to return.  No new trauma or symptoms.   04/22/2021 Patient states that his hips have been hurting him the last couple of days, R shoulder has been giving him some problems 04/03/2021.    05/20/2021 Patient states that hes doing good has some pain in his shoulders and hips    06/20/2021 Patient states that hes doing good   07/04/2021 Patient states that his back has been hurting and its radiating down to his hip and down his leg , no numbness tingling   5/11/023 Patient states he pulled a muscle in his back Tuesday had pain all down his leg hurts a little not as bad as it did on Tuesday    08/16/2021 Patient states that lower back and neck is tight    09/12/2021 Patient states that he is good just a tune up    10/21/2021 Patient states normal , thinks he did something to hip yesterday has pain from lateral hip to  ankle   9/21/023 Patient states he is good left hip had been hurting a week ago but it hasn't hurt since    12/26/2021 Patient states that he is pretty good just needs a tune up    01/23/2022 Patient states back pain this week    02/20/2022 Patient states had some pain in his right hip and lower back   03/20/2022 Patient states he pretty good , shoulder pain and lower back    04/17/2022 Patient states hips have been in pain    05/28/2022 Patient states hips and back are flared been caring for 2 babies    06/25/2022 Patient states  sharp pain in his right knee , hx of injury in high school, pain for a week , he has been wearing a knee brace the knee brace does help , no MOI   07/16/2022 Patient states that he is pretty good back and hip are in pain , knee is better    08/13/2022 Patient states grandma passed a couple of weeks ago, no pain just adjustment   12/11/2022 Patient states here for a tune up . Has been in bad back flare for about a month    Relevant Historical Information: None pertinent    Additional pertinent review of systems negative.  Current Outpatient Medications  Medication Sig Dispense Refill   hydrOXYzine (VISTARIL) 25 MG capsule Take 1 capsule (25 mg total) by mouth 2 (two) times daily as needed. 180 capsule 1   levocetirizine (XYZAL) 5 MG tablet Take 1 tablet (5 mg total) by mouth every evening. 90 tablet 1   montelukast (SINGULAIR) 10 MG tablet Take 1 tablet (10 mg total) by mouth at bedtime. 90 tablet 1   pantoprazole (PROTONIX) 20 MG tablet Take 1 tablet (20 mg total) by mouth daily as needed (Acid Reflux). 90 tablet 1   propranolol (INDERAL) 20 MG tablet Take 1 tablet (20 mg total) by mouth 2 (two) times daily. 180 tablet 1   venlafaxine XR (EFFEXOR XR) 75 MG 24 hr capsule Take 1 capsule (75 mg total) by mouth daily with breakfast. 90 capsule 1   No current facility-administered medications for this visit.      Objective:     Vitals:   12/11/22 1335  Pulse: 77  SpO2: 99%  Weight: 190 lb (86.2 kg)  Height: 5\' 11"  (1.803 m)      Body mass index is 26.5 kg/m.    Physical Exam:     General: Well-appearing, cooperative, sitting comfortably in no acute distress.   OMT Physical Exam:  ASIS Compression Test: Positive left Cervical: TTP paraspinal, C3-5 RRSR Rib: Bilateral elevated first rib with TTP on right Thoracic: TTP paraspinal, T3-5 RRSL Lumbar: TTP paraspinal, L1-3 RLSR, L5 RR Pelvis: Left anterior innominate  Electronically signed by:  Stephen Sexton D.[facility] 1:48 PM 12/11/22

## 2022-12-25 NOTE — Progress Notes (Deleted)
[name] D.[name] [address] [place] [address] Phone: [phone]   Assessment and Plan:     There are no diagnoses linked to this encounter.  *** - Patient has received relief with OMT in the past.  Elects for repeat OMT today.  Tolerated well per note below. - Decision today to treat with OMT was based on Physical Exam   After verbal consent patient was treated with HVLA (high velocity low amplitude), ME (muscle energy), FPR (flex positional release), ST (soft tissue), PC/PD (Pelvic Compression/ Pelvic Decompression) techniques in cervical, rib, thoracic, lumbar, and pelvic areas. Patient tolerated the procedure well with improvement in symptoms.  Patient educated on potential side effects of soreness and recommended to rest, hydrate, and use Tylenol as needed for pain control.   Pertinent previous records reviewed include ***   Follow Up: ***     Subjective:   I, Hanna Ra, am serving as a Neurosurgeon for [name]  Chief Complaint: OMT follow up    HPI:  01/22/21 Patient is a 22 year old male presenting with neck and back pain. Patient last saw [name] on 11/15/2020 for this reason and had OMT. Today patient states that he is here for maintenance OMT.     02/19/2021 Patient states after the last OMT visit the following week the neck got tight after laying on a friends bed gave him a crick in his neck for the next two days , back and neck still feel tight    03/25/2021 Patient states that he's feeling pretty good pain in his right shoulder blade.  States that he has felt good generally for about 4 weeks in between appointments, however going longer than 4 weeks he notices that pain started to return.  No new trauma or symptoms.   04/22/2021 Patient states that his hips have been hurting him the last couple of days, R shoulder has been giving him some problems 04/03/2021.    05/20/2021 Patient states that hes doing good  has some pain in his shoulders and hips    06/20/2021 Patient states that hes doing good   07/04/2021 Patient states that his back has been hurting and its radiating down to his hip and down his leg , no numbness tingling   5/11/023 Patient states he pulled a muscle in his back Tuesday had pain all down his leg hurts a little not as bad as it did on Tuesday    08/16/2021 Patient states that lower back and neck is tight    09/12/2021 Patient states that he is good just a tune up    10/21/2021 Patient states normal , thinks he did something to hip yesterday has pain from lateral hip to ankle   9/21/023 Patient states he is good left hip had been hurting a week ago but it hasn't hurt since    12/26/2021 Patient states that he is pretty good just needs a tune up    01/23/2022 Patient states back pain this week    02/20/2022 Patient states had some pain in his right hip and lower back   03/20/2022 Patient states he pretty good , shoulder pain and lower back    04/17/2022 Patient states hips have been in pain    05/28/2022 Patient states hips and back are flared been caring for 2 babies    06/25/2022 Patient states sharp pain in his right knee , hx of injury in high school, pain for a  week , he has been wearing a knee brace the knee brace does help , no MOI   07/16/2022 Patient states that he is pretty good back and hip are in pain , knee is better    08/13/2022 Patient states grandma passed a couple of weeks ago, no pain just adjustment    12/11/2022 Patient states here for a tune up . Has been in bad back flare for about a month  01/08/2023 Patient states     Relevant Historical Information: None pertinent Additional pertinent review of systems negative.  Current Outpatient Medications  Medication Sig Dispense Refill   hydrOXYzine (VISTARIL) 25 MG capsule Take 1 capsule (25 mg total) by mouth 2 (two) times daily as needed. 180 capsule 1   levocetirizine (XYZAL) 5 MG tablet  Take 1 tablet (5 mg total) by mouth every evening. 90 tablet 1   meloxicam (MOBIC) 15 MG tablet Take 1 tablet (15 mg total) by mouth daily. 30 tablet 0   montelukast (SINGULAIR) 10 MG tablet Take 1 tablet (10 mg total) by mouth at bedtime. 90 tablet 1   pantoprazole (PROTONIX) 20 MG tablet Take 1 tablet (20 mg total) by mouth daily as needed (Acid Reflux). 90 tablet 1   propranolol (INDERAL) 20 MG tablet Take 1 tablet (20 mg total) by mouth 2 (two) times daily. 180 tablet 1   venlafaxine XR (EFFEXOR XR) 75 MG 24 hr capsule Take 1 capsule (75 mg total) by mouth daily with breakfast. 90 capsule 1   No current facility-administered medications for this visit.      Objective:     There were no vitals filed for this visit.    There is no height or weight on file to calculate BMI.    Physical Exam:     General: Well-appearing, cooperative, sitting comfortably in no acute distress.   OMT Physical Exam:  ASIS Compression Test: Positive Right Cervical: TTP paraspinal, *** Rib: Bilateral elevated first rib with TTP Thoracic: TTP paraspinal,*** Lumbar: TTP paraspinal,*** Pelvis: Right anterior innominate  Electronically signed by:  [name] D.[name] 8:57 AM 12/25/22

## 2023-01-08 ENCOUNTER — Ambulatory Visit: Payer: 59 | Admitting: Sports Medicine

## 2023-01-12 NOTE — Progress Notes (Unsigned)
Stephen Sexton D.[facility] [address] [place] [address] Phone: [phone]   Assessment and Plan:    1. Chronic bilateral low back pain without sciatica 2. Somatic dysfunction of cervical region 3. Somatic dysfunction of thoracic region 4. Somatic dysfunction of lumbar region 5. Somatic dysfunction of pelvic region 6. Somatic dysfunction of rib region  -Chronic with exacerbation, subsequent sports medicine visit -Overall improvement in multiple musculoskeletal concerns, especially improvement in thoracic spine.  Still having intermittent tightness and pain in low back - Start HEP for low back - Discontinue meloxicam 15 mg daily and may use remainder medication as needed for breakthrough pain.  Do not recommend using chronic NSAIDs more frequently than 1-2 times per week - Patient has received relief with OMT in the past.  Elects for repeat OMT today.  Tolerated well per note below. - Decision today to treat with OMT was based on Physical Exam   15 additional minutes spent for educating Therapeutic Home Exercise Program.  This included exercises focusing on stretching, strengthening, with focus on eccentric aspects.   Long term goals include an improvement in range of motion, strength, endurance as well as avoiding reinjury. Patient's frequency would include in 1-2 times a day, 3-5 times a week for a duration of 6-12 weeks. Proper technique shown and discussed handout in great detail with ATC.  All questions were discussed and answered.    After verbal consent patient was treated with HVLA (high velocity low amplitude), ME (muscle energy), FPR (flex positional release), ST (soft tissue), PC/PD (Pelvic Compression/ Pelvic Decompression) techniques in cervical, rib, thoracic, lumbar, and pelvic areas. Patient tolerated the procedure well with improvement in symptoms.  Patient educated on potential side effects of soreness and recommended to rest, hydrate, and use  Tylenol as needed for pain control.   Pertinent previous records reviewed include none   Follow Up: 4 weeks for reevaluation.  Could consider repeat OMT.  If back pain continues to flare, could consider prednisone versus physical therapy   Subjective:   I, Stephen Sexton, am serving as a Neurosurgeon for [name]  Chief Complaint: OMT follow up    HPI:  01/22/21 Patient is a 22 year old male presenting with neck and back pain. Patient last saw [name] on 11/15/2020 for this reason and had OMT. Today patient states that he is here for maintenance OMT.     02/19/2021 Patient states after the last OMT visit the following week the neck got tight after laying on a friends bed gave him a crick in his neck for the next two days , back and neck still feel tight    03/25/2021 Patient states that he's feeling pretty good pain in his right shoulder blade.  States that he has felt good generally for about 4 weeks in between appointments, however going longer than 4 weeks he notices that pain started to return.  No new trauma or symptoms.   04/22/2021 Patient states that his hips have been hurting him the last couple of days, R shoulder has been giving him some problems 04/03/2021.    05/20/2021 Patient states that hes doing good has some pain in his shoulders and hips    06/20/2021 Patient states that hes doing good   07/04/2021 Patient states that his back has been hurting and its radiating down to his hip and down his leg , no numbness tingling   5/11/023 Patient states he pulled a muscle in his back  Tuesday had pain all down his leg hurts a little not as bad as it did on Tuesday    08/16/2021 Patient states that lower back and neck is tight    09/12/2021 Patient states that he is good just a tune up    10/21/2021 Patient states normal , thinks he did something to hip yesterday has pain from lateral hip to ankle   9/21/023 Patient states he is good left hip had been hurting a  week ago but it hasn't hurt since    12/26/2021 Patient states that he is pretty good just needs a tune up    01/23/2022 Patient states back pain this week    02/20/2022 Patient states had some pain in his right hip and lower back   03/20/2022 Patient states he pretty good , shoulder pain and lower back    04/17/2022 Patient states hips have been in pain    05/28/2022 Patient states hips and back are flared been caring for 2 babies    06/25/2022 Patient states sharp pain in his right knee , hx of injury in high school, pain for a week , he has been wearing a knee brace the knee brace does help , no MOI   07/16/2022 Patient states that he is pretty good back and hip are in pain , knee is better    08/13/2022 Patient states grandma passed a couple of weeks ago, no pain just adjustment    12/11/2022 Patient states here for a tune up . Has been in bad back flare for about a month  01/13/2023 Patient states still has pain in the thoracic/lumbar . But other than that feels good      Relevant Historical Information: None pertinent Additional pertinent review of systems negative.  Current Outpatient Medications  Medication Sig Dispense Refill   hydrOXYzine (VISTARIL) 25 MG capsule Take 1 capsule (25 mg total) by mouth 2 (two) times daily as needed. 180 capsule 1   levocetirizine (XYZAL) 5 MG tablet Take 1 tablet (5 mg total) by mouth every evening. 90 tablet 1   meloxicam (MOBIC) 15 MG tablet Take 1 tablet (15 mg total) by mouth daily. 30 tablet 0   montelukast (SINGULAIR) 10 MG tablet Take 1 tablet (10 mg total) by mouth at bedtime. 90 tablet 1   pantoprazole (PROTONIX) 20 MG tablet Take 1 tablet (20 mg total) by mouth daily as needed (Acid Reflux). 90 tablet 1   propranolol (INDERAL) 20 MG tablet Take 1 tablet (20 mg total) by mouth 2 (two) times daily. 180 tablet 1   venlafaxine XR (EFFEXOR XR) 75 MG 24 hr capsule Take 1 capsule (75 mg total) by mouth daily with breakfast. 90 capsule 1    No current facility-administered medications for this visit.      Objective:     Vitals:   01/13/23 0818  BP: 118/72  Pulse: 86  SpO2: 100%  Weight: 186 lb (84.4 kg)  Height: 5\' 11"  (1.803 m)      Body mass index is 25.94 kg/m.    Physical Exam:     General: Well-appearing, cooperative, sitting comfortably in no acute distress.   OMT Physical Exam:  ASIS Compression Test: Positive Right Cervical: TTP paraspinal, C3-5 RRSR Rib: Bilateral elevated first rib with NTTP Thoracic: TTP paraspinal, T6 RRSR Lumbar: TTP paraspinal, L1-3 RRSL, L5 RL Pelvis: Right anterior innominate  Electronically signed by:  Stephen Sexton D.[facility] 8:34 AM 01/13/23

## 2023-01-13 ENCOUNTER — Ambulatory Visit (INDEPENDENT_AMBULATORY_CARE_PROVIDER_SITE_OTHER): Payer: 59 | Admitting: Sports Medicine

## 2023-01-13 VITALS — BP 118/72 | HR 86 | Ht 71.0 in | Wt 186.0 lb

## 2023-01-13 DIAGNOSIS — G8929 Other chronic pain: Secondary | ICD-10-CM

## 2023-01-13 DIAGNOSIS — M9905 Segmental and somatic dysfunction of pelvic region: Secondary | ICD-10-CM

## 2023-01-13 DIAGNOSIS — M9902 Segmental and somatic dysfunction of thoracic region: Secondary | ICD-10-CM | POA: Diagnosis not present

## 2023-01-13 DIAGNOSIS — M545 Low back pain, unspecified: Secondary | ICD-10-CM

## 2023-01-13 DIAGNOSIS — M9901 Segmental and somatic dysfunction of cervical region: Secondary | ICD-10-CM | POA: Diagnosis not present

## 2023-01-13 DIAGNOSIS — M9903 Segmental and somatic dysfunction of lumbar region: Secondary | ICD-10-CM | POA: Diagnosis not present

## 2023-01-13 DIAGNOSIS — M9908 Segmental and somatic dysfunction of rib cage: Secondary | ICD-10-CM

## 2023-01-13 NOTE — Patient Instructions (Signed)
Low back HEP  Discontinue meloxicam and use remainder as needed  4 week follow up

## 2023-02-04 ENCOUNTER — Other Ambulatory Visit: Payer: Self-pay | Admitting: Medical Genetics

## 2023-02-11 ENCOUNTER — Other Ambulatory Visit: Payer: Self-pay | Admitting: Medical Genetics

## 2023-02-13 NOTE — Progress Notes (Unsigned)
Stephen Sexton D.[name] [address] [place] [address] Phone: [phone]   Assessment and Plan:     There are no diagnoses linked to this encounter.  *** - Patient has received relief with OMT in the past.  Elects for repeat OMT today.  Tolerated well per note below. - Decision today to treat with OMT was based on Physical Exam   After verbal consent patient was treated with HVLA (high velocity low amplitude), ME (muscle energy), FPR (flex positional release), ST (soft tissue), PC/PD (Pelvic Compression/ Pelvic Decompression) techniques in cervical, rib, thoracic, lumbar, and pelvic areas. Patient tolerated the procedure well with improvement in symptoms.  Patient educated on potential side effects of soreness and recommended to rest, hydrate, and use Tylenol as needed for pain control.   Pertinent previous records reviewed include ***    Follow Up: ***     Subjective:   I, Stephen Sexton, am serving as a Neurosurgeon for Doctor Stephen Sexton  Chief Complaint: OMT follow up    HPI:  01/22/21 Patient is a 22 year old male presenting with neck and back pain. Patient last saw [name] on 11/15/2020 for this reason and had OMT. Today patient states that he is here for maintenance OMT.     02/19/2021 Patient states after the last OMT visit the following week the neck got tight after laying on a friends bed gave him a crick in his neck for the next two days , back and neck still feel tight    03/25/2021 Patient states that he's feeling pretty good pain in his right shoulder blade.  States that he has felt good generally for about 4 weeks in between appointments, however going longer than 4 weeks he notices that pain started to return.  No new trauma or symptoms.   04/22/2021 Patient states that his hips have been hurting him the last couple of days, R shoulder has been giving him some problems 04/03/2021.    05/20/2021 Patient states that hes doing good  has some pain in his shoulders and hips    06/20/2021 Patient states that hes doing good   07/04/2021 Patient states that his back has been hurting and its radiating down to his hip and down his leg , no numbness tingling   5/11/023 Patient states he pulled a muscle in his back Tuesday had pain all down his leg hurts a little not as bad as it did on Tuesday    08/16/2021 Patient states that lower back and neck is tight    09/12/2021 Patient states that he is good just a tune up    10/21/2021 Patient states normal , thinks he did something to hip yesterday has pain from lateral hip to ankle   9/21/023 Patient states he is good left hip had been hurting a week ago but it hasn't hurt since    12/26/2021 Patient states that he is pretty good just needs a tune up    01/23/2022 Patient states back pain this week    02/20/2022 Patient states had some pain in his right hip and lower back   03/20/2022 Patient states he pretty good , shoulder pain and lower back    04/17/2022 Patient states hips have been in pain    05/28/2022 Patient states hips and back are flared been caring for 2 babies    06/25/2022 Patient states sharp pain in his right knee , hx of injury in high school, pain for  a week , he has been wearing a knee brace the knee brace does help , no MOI   07/16/2022 Patient states that he is pretty good back and hip are in pain , knee is better    08/13/2022 Patient states grandma passed a couple of weeks ago, no pain just adjustment    12/11/2022 Patient states here for a tune up . Has been in bad back flare for about a month   01/13/2023 Patient states still has pain in the thoracic/lumbar . But other than that feels good    02/16/2023 Patient states  Relevant Historical Information: Stephen Sexton pertinent  Additional pertinent review of systems negative.  Current Outpatient Medications  Medication Sig Dispense Refill   hydrOXYzine (VISTARIL) 25 MG capsule Take 1 capsule (25 mg  total) by mouth 2 (two) times daily as needed. 180 capsule 1   levocetirizine (XYZAL) 5 MG tablet Take 1 tablet (5 mg total) by mouth every evening. 90 tablet 1   meloxicam (MOBIC) 15 MG tablet Take 1 tablet (15 mg total) by mouth daily. 30 tablet 0   montelukast (SINGULAIR) 10 MG tablet Take 1 tablet (10 mg total) by mouth at bedtime. 90 tablet 1   pantoprazole (PROTONIX) 20 MG tablet Take 1 tablet (20 mg total) by mouth daily as needed (Acid Reflux). 90 tablet 1   propranolol (INDERAL) 20 MG tablet Take 1 tablet (20 mg total) by mouth 2 (two) times daily. 180 tablet 1   venlafaxine XR (EFFEXOR XR) 75 MG 24 hr capsule Take 1 capsule (75 mg total) by mouth daily with breakfast. 90 capsule 1   No current facility-administered medications for this visit.      Objective:     There were no vitals filed for this visit.    There is no height or weight on file to calculate BMI.    Physical Exam:     General: Well-appearing, cooperative, sitting comfortably in no acute distress.   OMT Physical Exam:  ASIS Compression Test: Positive Right Cervical: TTP paraspinal, *** Rib: Bilateral elevated first rib with TTP Thoracic: TTP paraspinal,*** Lumbar: TTP paraspinal,*** Pelvis: Right anterior innominate  Electronically signed by:  Stephen Sexton D.[name] 7:26 AM 02/13/23

## 2023-02-16 ENCOUNTER — Ambulatory Visit: Payer: 59 | Admitting: Sports Medicine

## 2023-03-10 ENCOUNTER — Other Ambulatory Visit (HOSPITAL_COMMUNITY): Payer: Self-pay | Attending: Medical Genetics

## 2023-03-17 IMAGING — CR DG LUMBAR SPINE COMPLETE 4+V
4 series · 4 of 4 positions shown · non-contrast
Comparison: None.

CLINICAL DATA: Low back pain.  No known injury.

EXAM:
LUMBAR SPINE - COMPLETE 4+ VIEW

[t lumbar spine ap]
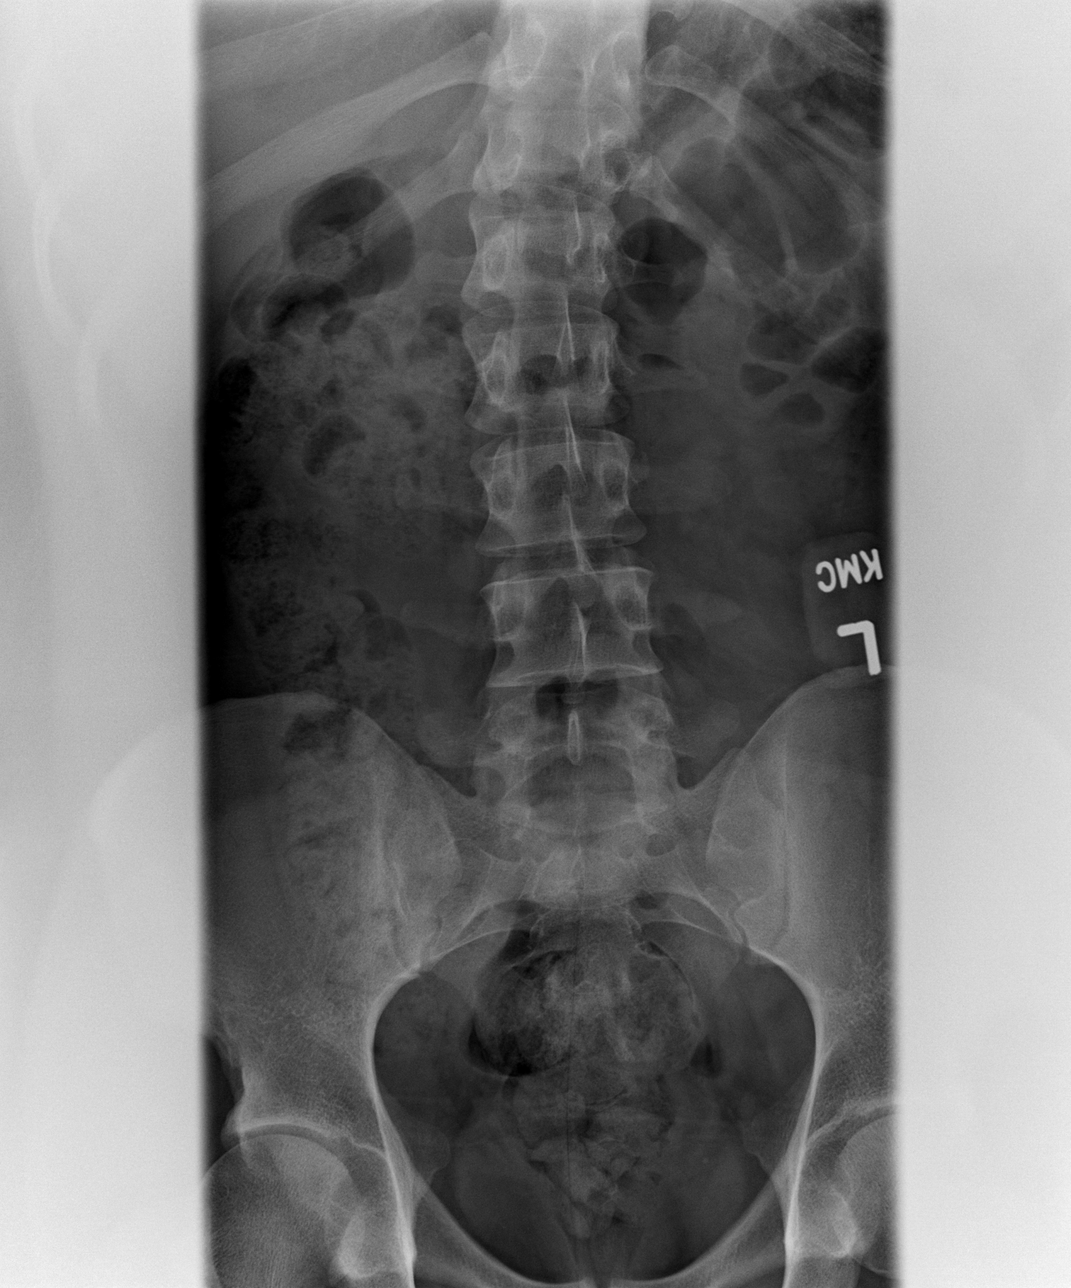

[t lumbar spine obl (1 of 2)]
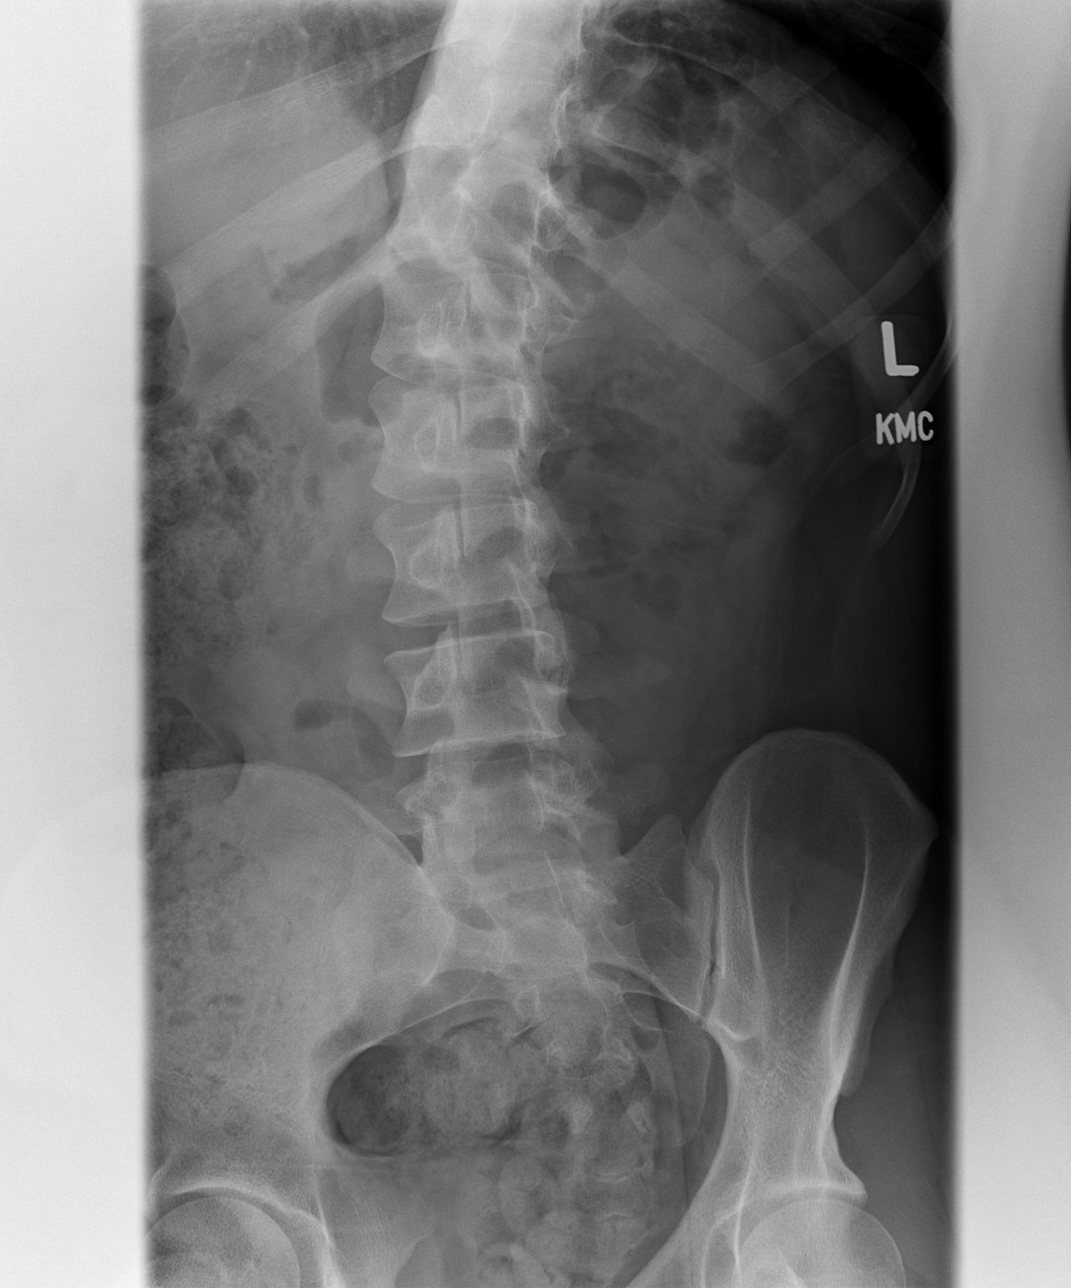

[t lumbar spine obl (2 of 2)]
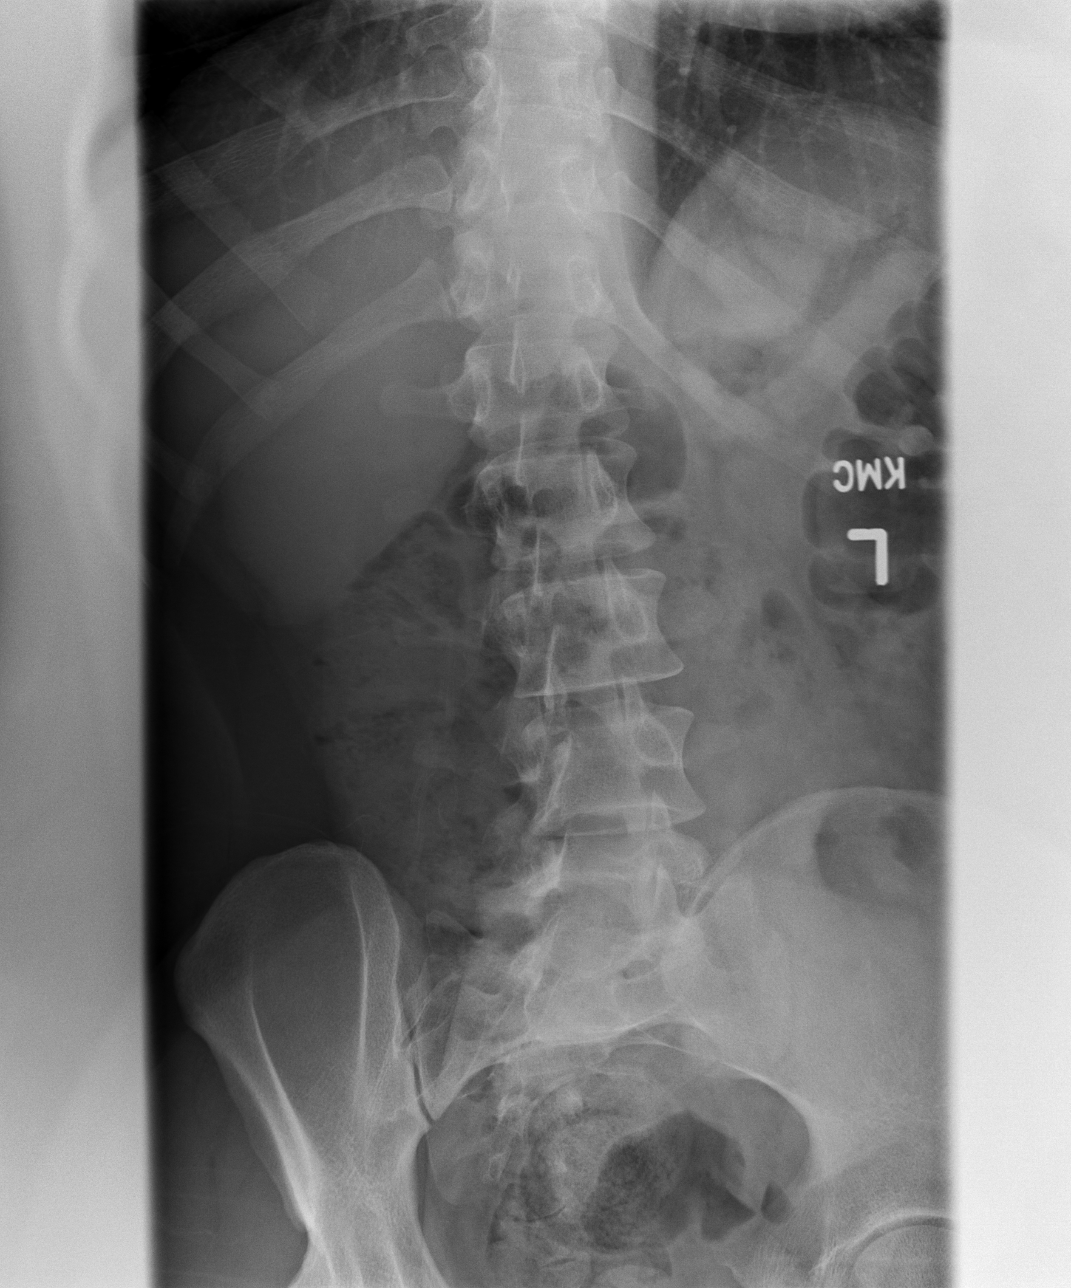

[t lumbar spine lat]
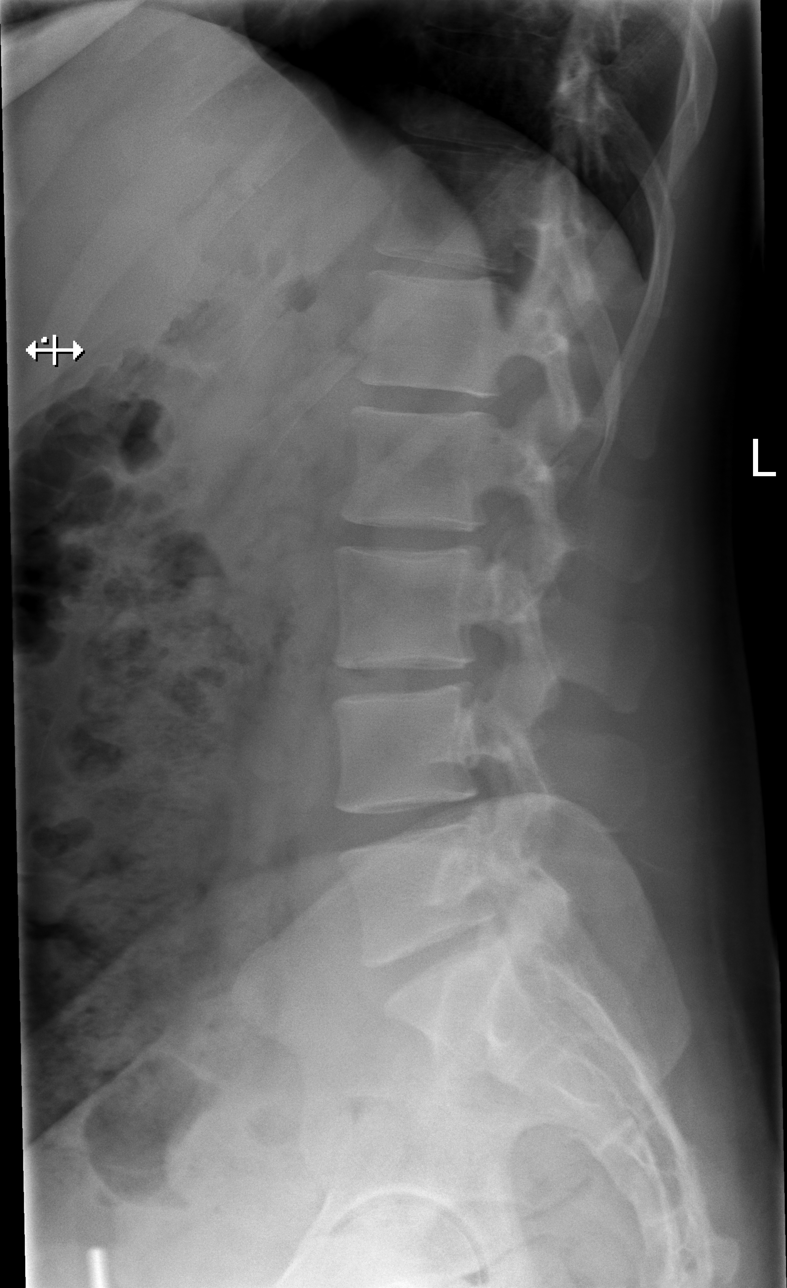

[4 of 4 positions shown; findings below may reference images not displayed]

FINDINGS: There are 5 non rib-bearing lumbar type vertebral bodies.

Mild scoliotic curvature of the thoracolumbar spine with caudal
component convex to the right measuring approximately 9 degrees (as
measured from the superior endplate of T12 to the inferior endplate
of L4).

There is straightening of the expected lumbar lordosis. No
anterolisthesis or retrolisthesis.

Lumbar vertebral body heights appear preserved.

Lumbar intervertebral disc space heights appear preserved.

Limited visualization of the bilateral SI joints is normal.

Large colonic stool burden without evidence of enteric obstruction.
IMPRESSION: Mild scoliotic curvature of the thoracolumbar spine with caudal
component convex to the right measuring approximately 9 degrees.
Otherwise, no explanation for patient's low back pain.

## 2023-03-24 ENCOUNTER — Ambulatory Visit (INDEPENDENT_AMBULATORY_CARE_PROVIDER_SITE_OTHER): Payer: 59 | Admitting: Family Medicine

## 2023-03-24 DIAGNOSIS — M545 Low back pain, unspecified: Secondary | ICD-10-CM

## 2023-03-24 DIAGNOSIS — G43719 Chronic migraine without aura, intractable, without status migrainosus: Secondary | ICD-10-CM

## 2023-03-24 DIAGNOSIS — Z91199 Patient's noncompliance with other medical treatment and regimen due to unspecified reason: Secondary | ICD-10-CM

## 2023-03-24 DIAGNOSIS — F418 Other specified anxiety disorders: Secondary | ICD-10-CM

## 2023-03-24 DIAGNOSIS — Z23 Encounter for immunization: Secondary | ICD-10-CM

## 2023-03-24 DIAGNOSIS — F5101 Primary insomnia: Secondary | ICD-10-CM

## 2023-03-24 DIAGNOSIS — K219 Gastro-esophageal reflux disease without esophagitis: Secondary | ICD-10-CM

## 2023-03-24 NOTE — Patient Instructions (Signed)

## 2023-03-24 NOTE — Progress Notes (Signed)
NO show appt.

## 2023-06-16 ENCOUNTER — Ambulatory Visit
Admission: RE | Admit: 2023-06-16 | Discharge: 2023-06-16 | Disposition: A | Discharge status: Home / Self Care | Source: Ambulatory Visit | Attending: Nurse Practitioner | Admitting: Nurse Practitioner

## 2023-06-16 VITALS — BP 121/74 | HR 107 | Temp 98.8°F | Resp 18

## 2023-06-16 DIAGNOSIS — J029 Acute pharyngitis, unspecified: Secondary | ICD-10-CM | POA: Insufficient documentation

## 2023-06-16 DIAGNOSIS — R590 Localized enlarged lymph nodes: Secondary | ICD-10-CM | POA: Diagnosis not present

## 2023-06-16 LAB — POCT RAPID STREP A (OFFICE): Rapid Strep A Screen: NEGATIVE

## 2023-06-16 LAB — POC COVID19/FLU A&B COMBO
Covid Antigen, POC: NEGATIVE
Influenza A Antigen, POC: NEGATIVE
Influenza B Antigen, POC: NEGATIVE

## 2023-06-16 MED ORDER — AMOXICILLIN 500 MG PO CAPS: 500.0000 mg | ORAL_CAPSULE | Freq: Two times a day (BID) | ORAL | 0 refills | Status: AC

## 2023-06-16 MED ORDER — LIDOCAINE VISCOUS HCL 2 % MT SOLN: 15.0000 mL | OROMUCOSAL | 0 refills | Status: AC | PRN

## 2023-06-16 NOTE — Discharge Instructions (Addendum)
 I think you have strep throat. The strep throat test today is negative.  The throat culture is pending.  Take the amoxicillin as prescribed to treat it.  Make sure you change your toothbrush today or tomorrow to prevent reinfection.  You can use the lidocaine rinses as needed for throat pain.  Seek care if symptoms do not improve with treatment.

## 2023-06-16 NOTE — ED Triage Notes (Signed)
 Fever, sore throat that started yesterday.

## 2023-06-16 NOTE — ED Provider Notes (Signed)
 [facility]    CSN: [id number] Arrival date & time: 06/16/23  1035      History   Chief Complaint Chief Complaint  Patient presents with   Fever    Have been having sinus pain all day and woke up with a 102 fever - Entered by patient    HPI Stephen Sexton is a 23 y.o. male.   Patient presents today with 1 day history of fever, Tmax 102 F, congested cough, runny and stuffy nose, sore throat, headache, and fatigue.  He denies shortness of breath or chest pain, abdominal pain, nausea/vomiting, diarrhea, and change in appetite.  No known sick contacts.  He took ibuprofen for the fever and sore throat which did seem to help a little bit.  Patient reports history of strep throat as a child.    Past Medical History:  Diagnosis Date   Abdominal migraine 09/08/2017   Acid reflux    ADHD    Anxiety    Cyclic vomiting syndrome    Depression    Dysphonia 04/14/2017   Environmental and seasonal allergies    Tension headache 12/26/2016    Patient Active Problem List   Diagnosis Date Noted   Somatic dysfunction of spine, lumbar 10/18/2020   Slow transit constipation 10/08/2020   Spine curvature, acquired 09/17/2020   Lumbar pain 09/17/2020   Depression with anxiety 10/24/2019   Allergic rhinitis due to pollen 10/24/2019   Cyclic vomiting syndrome 10/24/2019   Insomnia 10/24/2019   Gastroesophageal reflux disease without esophagitis 10/24/2019   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 Authorizing Provider  amoxicillin (AMOXIL) 500 MG capsule Take 1 capsule (500 mg total) by mouth 2 (two) times daily for 10 days. 06/16/23 06/26/23 Yes Cathlean Marseilles A, NP  lidocaine (XYLOCAINE) 2 % solution Use as directed 15 mLs in the mouth or throat every 3 (three) hours as  needed for mouth pain. 06/16/23  Yes Valentino Nose, NP  levocetirizine (XYZAL) 5 MG tablet Take 1 tablet (5 mg total) by mouth every evening. 09/05/22   Claiborne Billings, Ezequiel Essex, DO    Family History Family History  Problem Relation Age of Onset   Arthritis Mother    Diabetes Mother    Hypertension Mother    Hyperlipidemia Mother    Arthritis Father    Drug abuse Brother    Arthritis Maternal Grandmother    Cancer Maternal Grandmother    Depression Maternal Grandmother    Diabetes Maternal Grandmother    Hyperlipidemia Maternal Grandmother    Hypertension Maternal Grandmother    Miscarriages / Stillbirths Maternal Grandmother    Arthritis Maternal Grandfather    Cancer Maternal Grandfather    Diabetes Maternal Grandfather    Heart attack Maternal Grandfather    Heart disease Maternal Grandfather    Hyperlipidemia Maternal Grandfather    Hypertension Maternal Grandfather    Miscarriages / Stillbirths Paternal Grandmother    Stroke Paternal Grandmother    Hypertension Paternal Grandmother    Diabetes Paternal Grandmother    Cancer Paternal Grandmother    Cancer Paternal Grandfather    Diabetes Paternal Grandfather     Social History Social History   Tobacco Use   Smoking status: Never   Smokeless tobacco: Never  Vaping Use  Vaping status: Never Used  Substance Use Topics   Alcohol use: Not Currently   Drug use: Never     Allergies   Amitriptyline   Review of Systems Review of Systems Per HPI  Physical Exam Triage Vital Signs ED Triage Vitals  Encounter Vitals Group     BP 06/16/23 1050 121/74     Systolic BP Percentile --      Diastolic BP Percentile --      Pulse Rate 06/16/23 1050 (!) 107     Resp 06/16/23 1050 18     Temp 06/16/23 1050 98.8 F (37.1 C)     Temp Source 06/16/23 1050 Oral     SpO2 06/16/23 1050 97 %     Weight --      Height --      Head Circumference --      Peak Flow --      Pain Score 06/16/23 1051 3     Pain Loc --      Pain  Education --      Exclude from Growth Chart --    No data found.  Updated Vital Signs BP 121/74 (BP Location: Right Arm)   Pulse (!) 107   Temp 98.8 F (37.1 C) (Oral)   Resp 18   SpO2 97%   Visual Acuity Right Eye Distance:   Left Eye Distance:   Bilateral Distance:    Right Eye Near:   Left Eye Near:    Bilateral Near:     Physical Exam Vitals and nursing note reviewed.  Constitutional:      General: He is not in acute distress.    Appearance: He is well-developed. He is not ill-appearing, toxic-appearing or diaphoretic.  HENT:     Head: Normocephalic and atraumatic.     Right Ear: Tympanic membrane and ear canal normal. No drainage, swelling or tenderness. No middle ear effusion. Tympanic membrane is not erythematous.     Left Ear: Tympanic membrane and ear canal normal. No drainage, swelling or tenderness.  No middle ear effusion. Tympanic membrane is not erythematous.     Nose: No congestion or rhinorrhea.     Mouth/Throat:     Mouth: Mucous membranes are dry.     Pharynx: Posterior oropharyngeal erythema present. No uvula swelling.     Tonsils: No tonsillar exudate or tonsillar abscesses.  Eyes:     Conjunctiva/sclera: Conjunctivae normal.  Cardiovascular:     Rate and Rhythm: Tachycardia present.  Pulmonary:     Effort: Pulmonary effort is normal. No respiratory distress.     Breath sounds: Normal breath sounds. No wheezing, rhonchi or rales.  Musculoskeletal:     Cervical back: Neck supple.  Lymphadenopathy:     Cervical: Cervical adenopathy present.  Skin:    General: Skin is warm and dry.     Coloration: Skin is not pale.     Findings: No erythema or rash.  Neurological:     Mental Status: He is alert and oriented to person, place, and time.      UC Treatments / Results  Labs (all labs ordered are listed, but only abnormal results are displayed) Labs Reviewed  CULTURE, GROUP A STREP [facility])  POCT RAPID STREP A (OFFICE)  POC COVID19/FLU A&B COMBO     EKG   Radiology No results found.  Procedures Procedures (including critical care time)  Medications Ordered in UC Medications - No data to display  Initial Impression / Assessment and Plan / UC Course  I have reviewed the triage vital signs and the nursing notes.  Pertinent labs & imaging results that were available during my care of the patient were reviewed by me and considered in my medical decision making (see chart for details).   Patient is mildly tachycardic in triage, otherwise vital signs are stable.  1. Acute pharyngitis, unspecified etiology 2. Cervical lymphadenopathy Rapid strep negative, throat culture pending COVID-19 and influenza testing is negative I am highly suspicious for strep throat given presentation and cervical lymphadenopathy, will treat with amoxicillin twice daily for 10 days and start lidocaine rinses as needed for throat pain Change toothbrush after starting treatment ER and return precautions discussed Work excuse provided  The patient was given the opportunity to ask questions.  All questions answered to their satisfaction.  The patient is in agreement to this plan.   Final Clinical Impressions(s) / UC Diagnoses   Final diagnoses:  Acute pharyngitis, unspecified etiology  Cervical lymphadenopathy     Discharge Instructions      I think you have strep throat. The strep throat test today is negative.  The throat culture is pending.  Take the amoxicillin as prescribed to treat it.  Make sure you change your toothbrush today or tomorrow to prevent reinfection.  You can use the lidocaine rinses as needed for throat pain.  Seek care if symptoms do not improve with treatment.     ED Prescriptions     Medication Sig Dispense Auth. Provider   amoxicillin (AMOXIL) 500 MG capsule Take 1 capsule (500 mg total) by mouth 2 (two) times daily for 10 days. 20 capsule Cathlean Marseilles A, NP   lidocaine (XYLOCAINE) 2 % solution Use as directed 15  mLs in the mouth or throat every 3 (three) hours as needed for mouth pain. 100 mL Valentino Nose, NP      PDMP not reviewed this encounter.   Valentino Nose, NP 06/16/23 1149

## 2023-06-19 LAB — CULTURE, GROUP A STREP (THRC)

## 2023-07-13 ENCOUNTER — Ambulatory Visit
Admission: RE | Admit: 2023-07-13 | Discharge: 2023-07-13 | Disposition: A | Discharge status: Home / Self Care | Source: Ambulatory Visit

## 2023-07-13 VITALS — BP 120/79 | HR 84 | Temp 98.0°F | Resp 18

## 2023-07-13 DIAGNOSIS — L02415 Cutaneous abscess of right lower limb: Secondary | ICD-10-CM

## 2023-07-13 MED ORDER — DOXYCYCLINE HYCLATE 100 MG PO CAPS: 100.0000 mg | ORAL_CAPSULE | Freq: Two times a day (BID) | ORAL | 0 refills | Status: AC

## 2023-07-13 NOTE — ED Provider Notes (Signed)
 [facility]    CSN: [id number] Arrival date & time: 07/13/23  0957      History   Chief Complaint Chief Complaint  Patient presents with   Leg Pain    I have a leg pain that started 5/4 that it hurts to walk around - Entered by patient    HPI Stephen Sexton is a 23 y.o. male.   Patient presents today with a few day history of right lower extremity soreness.  Soreness is worse with ambulation.  No recent fall, trauma, or known injury to the right leg.  Reports he frequently bumps into boxes and other sharp objects at work and has a couple cuts on his right leg. He noticed yesterday one was draining pus a little bit.  He denies fever or nausea/vomiting.  Reports the area that was draining pus is tender to touch and red.    Past Medical History:  Diagnosis Date   Abdominal migraine 09/08/2017   Acid reflux    ADHD    Anxiety    Cyclic vomiting syndrome    Depression    Dysphonia 04/14/2017   Environmental and seasonal allergies    Tension headache 12/26/2016    Patient Active Problem List   Diagnosis Date Noted   Somatic dysfunction of spine, lumbar 10/18/2020   Slow transit constipation 10/08/2020   Spine curvature, acquired 09/17/2020   Lumbar pain 09/17/2020   Depression with anxiety 10/24/2019   Allergic rhinitis due to pollen 10/24/2019   Cyclic vomiting syndrome 10/24/2019   Insomnia 10/24/2019   Gastroesophageal reflux disease without esophagitis 10/24/2019   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 Authorizing Provider  doxycycline (VIBRAMYCIN) 100 MG capsule Take 1 capsule (100 mg total) by mouth 2 (two) times daily for 7 days. 07/13/23 07/20/23 Yes Wilhemena Harbour, NP  montelukast  (SINGULAIR ) 5 MG chewable tablet Chew 5 mg by mouth at  bedtime.   Yes [provider]  levocetirizine (XYZAL ) 5 MG tablet Take 1 tablet (5 mg total) by mouth every evening. 09/05/22   Kuneff, Renee A, DO  lidocaine  (XYLOCAINE ) 2 % solution Use as directed 15 mLs in the mouth or throat every 3 (three) hours as needed for mouth pain. 06/16/23   Wilhemena Harbour, NP    Family History Family History  Problem Relation Age of Onset   Arthritis Mother    Diabetes Mother    Hypertension Mother    Hyperlipidemia Mother    Arthritis Father    Drug abuse Brother    Arthritis Maternal Grandmother    Cancer Maternal Grandmother    Depression Maternal Grandmother    Diabetes Maternal Grandmother    Hyperlipidemia Maternal Grandmother    Hypertension Maternal Grandmother    Miscarriages / Stillbirths Maternal Grandmother    Arthritis Maternal Grandfather    Cancer Maternal Grandfather    Diabetes Maternal Grandfather    Heart attack Maternal Grandfather    Heart disease Maternal Grandfather    Hyperlipidemia Maternal Grandfather    Hypertension Maternal Grandfather    Miscarriages / Stillbirths Paternal Grandmother    Stroke Paternal Grandmother    Hypertension Paternal Grandmother    Diabetes Paternal Grandmother    Cancer Paternal Grandmother    Cancer Paternal Grandfather  Diabetes Paternal Grandfather     Social History Social History   Tobacco Use   Smoking status: Never   Smokeless tobacco: Never  Vaping Use   Vaping status: Never Used  Substance Use Topics   Alcohol use: Not Currently   Drug use: Never     Allergies   Amitriptyline    Review of Systems Review of Systems Per HPI  Physical Exam Triage Vital Signs ED Triage Vitals  Encounter Vitals Group     BP 07/13/23 1022 120/79     Systolic BP Percentile --      Diastolic BP Percentile --      Pulse Rate 07/13/23 1022 84     Resp 07/13/23 1022 18     Temp 07/13/23 1022 98 F (36.7 C)     Temp Source 07/13/23 1022 Oral     SpO2 07/13/23 1022 97 %      Weight --      Height --      Head Circumference --      Peak Flow --      Pain Score 07/13/23 1024 4     Pain Loc --      Pain Education --      Exclude from Growth Chart --    No data found.  Updated Vital Signs BP 120/79 (BP Location: Right Arm)   Pulse 84   Temp 98 F (36.7 C) (Oral)   Resp 18   SpO2 97%   Visual Acuity Right Eye Distance:   Left Eye Distance:   Bilateral Distance:    Right Eye Near:   Left Eye Near:    Bilateral Near:     Physical Exam Vitals and nursing note reviewed.  Constitutional:      General: He is not in acute distress.    Appearance: Normal appearance. He is not toxic-appearing.  HENT:     Head: Normocephalic and atraumatic.     Mouth/Throat:     Mouth: Mucous membranes are moist.  Pulmonary:     Effort: Pulmonary effort is normal. No respiratory distress.  Skin:    General: Skin is warm and dry.     Capillary Refill: Capillary refill takes less than 2 seconds.     Coloration: Skin is not jaundiced or pale.     Findings: Erythema present.          Comments: 2 erythematous nodules noted to approximately areas marked as above.  There is surrounding erythema of the most distal nodule and it is tender to touch.  The more proximal nodule is not tender to touch and there is no surrounding erythema or active drainage.  Neurological:     Mental Status: He is alert and oriented to person, place, and time.  Psychiatric:        Behavior: Behavior is cooperative.      UC Treatments / Results  Labs (all labs ordered are listed, but only abnormal results are displayed) Labs Reviewed - No data to display  EKG   Radiology No results found.  Procedures Procedures (including critical care time)  Medications Ordered in UC Medications - No data to display  Initial Impression / Assessment and Plan / UC Course  I have reviewed the triage vital signs and the nursing notes.  Pertinent labs & imaging results that were available  during my care of the patient were reviewed by me and considered in my medical decision making (see chart for details).   Patient is well-appearing, normotensive,  afebrile, not tachycardic, not tachypneic, oxygenating well on room air.    1. Abscess of right lower extremity Will treat patient for draining abscess of right lower extremity; no indication for I&D today Start oral doxycycline twice daily for 7 days Wound care discussed Return and ER precautions discussed Work excuse provided  The patient was given the opportunity to ask questions.  All questions answered to their satisfaction.  The patient is in agreement to this plan.   Final Clinical Impressions(s) / UC Diagnoses   Final diagnoses:  Abscess of right lower extremity     Discharge Instructions      Take the oral antibiotic as prescribed to treat for infection in your skin.  Keep the wound clean with soap and water twice daily and apply a thin layer of clear ointment over the areas to help with wound healing.  Avoid picking at or touching the areas otherwise.  You can apply warm compresses, but again do not pick at the areas.  You can take Tylenol  or ibuprofen as needed for pain.  Seek care if symptoms do not improve with treatment.    ED Prescriptions     Medication Sig Dispense Auth. Provider   doxycycline (VIBRAMYCIN) 100 MG capsule Take 1 capsule (100 mg total) by mouth 2 (two) times daily for 7 days. 14 capsule Wilhemena Harbour, NP      PDMP not reviewed this encounter.   Wilhemena Harbour, NP 07/13/23 1436

## 2023-07-13 NOTE — ED Triage Notes (Signed)
 Pt reports sore on the right lower leg that has been present for a few weeks but while working yesterday pt noticed a pain in the leg when walking and pus leaking from the site of the sore, redness is present.

## 2023-07-13 NOTE — Discharge Instructions (Signed)
 Take the oral antibiotic as prescribed to treat for infection in your skin.  Keep the wound clean with soap and water twice daily and apply a thin layer of clear ointment over the areas to help with wound healing.  Avoid picking at or touching the areas otherwise.  You can apply warm compresses, but again do not pick at the areas.  You can take Tylenol  or ibuprofen as needed for pain.  Seek care if symptoms do not improve with treatment.

## 2024-02-11 ENCOUNTER — Ambulatory Visit
Admission: RE | Admit: 2024-02-11 | Discharge: 2024-02-11 | Disposition: A | Discharge status: Home / Self Care | Attending: Nurse Practitioner | Admitting: Nurse Practitioner

## 2024-02-11 VITALS — BP 134/88 | HR 88 | Temp 98.9°F | Resp 18

## 2024-02-11 DIAGNOSIS — J069 Acute upper respiratory infection, unspecified: Secondary | ICD-10-CM

## 2024-02-11 MED ORDER — PROMETHAZINE-DM 6.25-15 MG/5ML PO SYRP: 5.0000 mL | ORAL_SOLUTION | Freq: Every evening | ORAL | 0 refills | Status: AC | PRN

## 2024-02-11 MED ORDER — BENZONATATE 100 MG PO CAPS: 100.0000 mg | ORAL_CAPSULE | Freq: Three times a day (TID) | ORAL | 0 refills | Status: AC | PRN

## 2024-02-11 NOTE — Discharge Instructions (Signed)
 You have a viral upper respiratory infection.  Symptoms should improve over the next week to 10 days.  If you develop chest pain or shortness of breath, go to the emergency room.  Some things that can make you feel better are: - Increased rest - Increasing fluid with water/sugar free electrolytes - Acetaminophen and ibuprofen as needed for fever/pain - Salt water gargling, chloraseptic spray and throat lozenges - OTC guaifenesin (Mucinex) 600 mg twice daily for congestion - Saline sinus flushes or a neti pot - Humidifying the air -Tessalon Perles every 8 hours as needed for dry cough and cough syrup at night time as needed

## 2024-02-11 NOTE — ED Provider Notes (Signed)
 [facility]    CSN: [id number] Arrival date & time: 02/11/24  1549      History   Chief Complaint Chief Complaint  Patient presents with   Cough    My cough and congestion have been getting worse progressively worse and I've been feeling more feverish each day as well - Entered by patient    HPI Stephen Sexton is a 23 y.o. male.   Patient presents today with 4 days of fever, Tmax 100.3, congested, nonprodutive, cough, shortness of breath after coughing, nasal congestion, runny nose, sore throat, headache that has now improved, bilateral ear pain, decreased appetite, and fatigue.  No chest pain, abdominal pain, nausea/vomiting, diarrhea.  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  Has been taking Mucinex and ibuprofen for symptoms without much improvement.    Past Medical History:  Diagnosis Date   Abdominal migraine 09/08/2017   Acid reflux    ADHD    Anxiety    Cyclic vomiting syndrome    Depression    Dysphonia 04/14/2017   Environmental and seasonal allergies    Tension headache 12/26/2016    Patient Active Problem List   Diagnosis Date Noted   Somatic dysfunction of spine, lumbar 10/18/2020   Slow transit constipation 10/08/2020   Spine curvature, acquired 09/17/2020   Lumbar pain 09/17/2020   Depression with anxiety 10/24/2019   Allergic rhinitis due to pollen 10/24/2019   Cyclic vomiting syndrome 10/24/2019   Insomnia 10/24/2019   Gastroesophageal reflux disease without esophagitis 10/24/2019   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 Authorizing Provider  benzonatate (TESSALON) 100 MG capsule Take 1 capsule (100 mg total) by mouth 3 (three) times daily as needed for cough. Do not take with alcohol or while operating or  driving heavy machinery 87/5/74  Yes Chandra Harlene LABOR, NP  promethazine -dextromethorphan (PROMETHAZINE -DM) 6.25-15 MG/5ML syrup Take 5 mLs by mouth at bedtime as needed for cough. Do not take with alcohol or while driving or operating heavy machinery.  May cause drowsiness. 02/11/24  Yes Chandra Harlene LABOR, NP  levocetirizine (XYZAL ) 5 MG tablet Take 1 tablet (5 mg total) by mouth every evening. 09/05/22   Kuneff, Renee A, DO  lidocaine  (XYLOCAINE ) 2 % solution Use as directed 15 mLs in the mouth or throat every 3 (three) hours as needed for mouth pain. 06/16/23   Chandra Harlene LABOR, NP  montelukast  (SINGULAIR ) 5 MG chewable tablet Chew 5 mg by mouth at bedtime.    [provider]    Family History Family History  Problem Relation Age of Onset   Arthritis Mother    Diabetes Mother    Hypertension Mother    Hyperlipidemia Mother    Arthritis Father    Drug abuse Brother    Arthritis Maternal Grandmother    Cancer Maternal Grandmother    Depression Maternal Grandmother    Diabetes Maternal Grandmother    Hyperlipidemia Maternal Grandmother    Hypertension Maternal Grandmother    Miscarriages / Stillbirths Maternal Grandmother    Arthritis Maternal Grandfather    Cancer Maternal Grandfather    Diabetes Maternal Grandfather    Heart attack Maternal Grandfather    Heart disease Maternal Grandfather    Hyperlipidemia Maternal Grandfather  Hypertension Maternal Grandfather    Miscarriages / Stillbirths Paternal Grandmother    Stroke Paternal Grandmother    Hypertension Paternal Grandmother    Diabetes Paternal Grandmother    Cancer Paternal Grandmother    Cancer Paternal Grandfather    Diabetes Paternal Grandfather     Social History Social History   Tobacco Use   Smoking status: Never   Smokeless tobacco: Never  Vaping Use   Vaping status: Never Used  Substance Use Topics   Alcohol use: Not Currently   Drug use: Never     Allergies    Amitriptyline    Review of Systems Review of Systems Per HPI  Physical Exam Triage Vital Signs ED Triage Vitals [02/11/24 1604]  Encounter Vitals Group     BP 134/88     Girls Systolic BP Percentile      Girls Diastolic BP Percentile      Boys Systolic BP Percentile      Boys Diastolic BP Percentile      Pulse Rate 88     Resp 18     Temp 98.9 F (37.2 C)     Temp Source Oral     SpO2 98 %     Weight      Height      Head Circumference      Peak Flow      Pain Score 0     Pain Loc      Pain Education      Exclude from Growth Chart    No data found.  Updated Vital Signs BP 134/88 (BP Location: Right Arm)   Pulse 88   Temp 98.9 F (37.2 C) (Oral)   Resp 18   SpO2 98%   Visual Acuity Right Eye Distance:   Left Eye Distance:   Bilateral Distance:    Right Eye Near:   Left Eye Near:    Bilateral Near:     Physical Exam Vitals and nursing note reviewed.  Constitutional:      General: He is not in acute distress.    Appearance: Normal appearance. He is not ill-appearing or toxic-appearing.  HENT:     Head: Normocephalic and atraumatic.     Right Ear: Tympanic membrane, ear canal and external ear normal.     Left Ear: Tympanic membrane, ear canal and external ear normal.     Nose: Congestion present. No rhinorrhea.     Mouth/Throat:     Mouth: Mucous membranes are moist.     Pharynx: Oropharynx is clear. No oropharyngeal exudate or posterior oropharyngeal erythema.  Eyes:     General: No scleral icterus.    Extraocular Movements: Extraocular movements intact.  Cardiovascular:     Rate and Rhythm: Normal rate and regular rhythm.  Pulmonary:     Effort: Pulmonary effort is normal. No respiratory distress.     Breath sounds: Normal breath sounds. No wheezing, rhonchi or rales.  Musculoskeletal:     Cervical back: Normal range of motion and neck supple.  Lymphadenopathy:     Cervical: No cervical adenopathy.  Skin:    General: Skin is warm and dry.      Coloration: Skin is not jaundiced or pale.     Findings: No erythema or rash.  Neurological:     Mental Status: He is alert and oriented to person, place, and time.  Psychiatric:        Behavior: Behavior is cooperative.      UC Treatments / Results  Labs (  all labs ordered are listed, but only abnormal results are displayed) Labs Reviewed - No data to display  EKG   Radiology No results found.  Procedures Procedures (including critical care time)  Medications Ordered in UC Medications - No data to display  Initial Impression / Assessment and Plan / UC Course  I have reviewed the triage vital signs and the nursing notes.  Pertinent labs & imaging results that were available during my care of the patient were reviewed by me and considered in my medical decision making (see chart for details).   Patient is a very pleasant, well appearing 23 year old male.  Vital signs are stable and exam is reassuring today.  COVID-19 and influenza testing deferred given length of symptoms.  Supportive care discussed, continue Mucinex, start cough suppressant medication.  ER and return precautions discussed.  Work excuse provided.  The patient was given the opportunity to ask questions.  All questions answered to their satisfaction.  The patient is in agreement to this plan.   Final Clinical Impressions(s) / UC Diagnoses   Final diagnoses:  Viral URI with cough     Discharge Instructions      You have a viral upper respiratory infection.  Symptoms should improve over the next week to 10 days.  If you develop chest pain or shortness of breath, go to the emergency room.    Some things that can make you feel better are: - Increased rest - Increasing fluid with water/sugar free electrolytes - Acetaminophen  and ibuprofen as needed for fever/pain - Salt water gargling, chloraseptic spray and throat lozenges - OTC guaifenesin (Mucinex) 600 mg twice daily for congestion - Saline sinus  flushes or a neti pot - Humidifying the air -Tessalon Perles every 8 hours as needed for dry cough and cough syrup at night time as needed     ED Prescriptions     Medication Sig Dispense Auth. Provider   benzonatate (TESSALON) 100 MG capsule Take 1 capsule (100 mg total) by mouth 3 (three) times daily as needed for cough. Do not take with alcohol or while operating or driving heavy machinery 21 capsule Chandra Raisin A, NP   promethazine -dextromethorphan (PROMETHAZINE -DM) 6.25-15 MG/5ML syrup Take 5 mLs by mouth at bedtime as needed for cough. Do not take with alcohol or while driving or operating heavy machinery.  May cause drowsiness. 118 mL Chandra Raisin LABOR, NP      PDMP not reviewed this encounter.   Chandra Raisin LABOR, NP 02/11/24 [phone]

## 2024-02-11 NOTE — ED Triage Notes (Signed)
 PT REPORTS HE HAS A COUGH AND NASAL CONGESTION X 4 DAYS
